# Patient Record
Sex: Female | Born: 1970 | Race: White | Hispanic: No | Marital: Married | Smoking: Current every day smoker
Health system: Southern US, Community
[De-identification: ages and names within clinical notes are randomized; demographics above are authoritative.]

## PROBLEM LIST (undated history)

## (undated) DIAGNOSIS — F329 Major depressive disorder, single episode, unspecified: Secondary | ICD-10-CM

## (undated) DIAGNOSIS — I10 Essential (primary) hypertension: Secondary | ICD-10-CM

## (undated) DIAGNOSIS — F32A Depression, unspecified: Secondary | ICD-10-CM

## (undated) DIAGNOSIS — F419 Anxiety disorder, unspecified: Secondary | ICD-10-CM

## (undated) DIAGNOSIS — G4700 Insomnia, unspecified: Secondary | ICD-10-CM

## (undated) DIAGNOSIS — M47817 Spondylosis without myelopathy or radiculopathy, lumbosacral region: Secondary | ICD-10-CM

## (undated) HISTORY — PX: CHOLECYSTECTOMY: SHX55

## (undated) HISTORY — PX: TOTAL ABDOMINAL HYSTERECTOMY: SHX209

## (undated) HISTORY — PX: OOPHORECTOMY: SHX86

## (undated) HISTORY — PX: LUMBAR MICRODISCECTOMY: SHX99

---

## 1998-05-11 ENCOUNTER — Ambulatory Visit (HOSPITAL_COMMUNITY): Admission: RE | Admit: 1998-05-11 | Discharge: 1998-05-11 | Payer: Self-pay | Admitting: Orthopaedic Surgery

## 1998-05-25 ENCOUNTER — Ambulatory Visit (HOSPITAL_COMMUNITY): Admission: RE | Admit: 1998-05-25 | Discharge: 1998-05-25 | Payer: Self-pay | Admitting: Orthopaedic Surgery

## 1998-06-08 ENCOUNTER — Ambulatory Visit (HOSPITAL_COMMUNITY): Admission: RE | Admit: 1998-06-08 | Discharge: 1998-06-08 | Payer: Self-pay | Admitting: Orthopaedic Surgery

## 2000-11-05 ENCOUNTER — Encounter: Admission: RE | Admit: 2000-11-05 | Discharge: 2000-11-05 | Payer: Self-pay | Admitting: Obstetrics and Gynecology

## 2000-11-05 ENCOUNTER — Encounter: Payer: Self-pay | Admitting: Obstetrics and Gynecology

## 2004-06-29 ENCOUNTER — Encounter: Admission: RE | Admit: 2004-06-29 | Discharge: 2004-06-29 | Payer: Self-pay | Admitting: Neurosurgery

## 2004-07-13 ENCOUNTER — Encounter: Admission: RE | Admit: 2004-07-13 | Discharge: 2004-07-13 | Payer: Self-pay | Admitting: Neurosurgery

## 2004-08-16 ENCOUNTER — Ambulatory Visit (HOSPITAL_COMMUNITY): Admission: RE | Admit: 2004-08-16 | Discharge: 2004-08-16 | Payer: Self-pay | Admitting: Neurosurgery

## 2004-08-25 ENCOUNTER — Encounter: Admission: RE | Admit: 2004-08-25 | Discharge: 2004-08-25 | Payer: Self-pay | Admitting: Neurosurgery

## 2004-12-05 ENCOUNTER — Encounter: Admission: RE | Admit: 2004-12-05 | Discharge: 2004-12-05 | Payer: Self-pay | Admitting: Neurosurgery

## 2004-12-20 ENCOUNTER — Encounter: Admission: RE | Admit: 2004-12-20 | Discharge: 2004-12-20 | Payer: Self-pay | Admitting: Neurosurgery

## 2005-09-08 ENCOUNTER — Encounter: Admission: RE | Admit: 2005-09-08 | Discharge: 2005-09-08 | Payer: Self-pay | Admitting: Neurosurgery

## 2008-07-10 ENCOUNTER — Ambulatory Visit (HOSPITAL_COMMUNITY): Admission: RE | Admit: 2008-07-10 | Discharge: 2008-07-11 | Payer: Self-pay | Admitting: Neurosurgery

## 2008-11-03 ENCOUNTER — Ambulatory Visit (HOSPITAL_COMMUNITY): Admission: RE | Admit: 2008-11-03 | Discharge: 2008-11-03 | Payer: Self-pay | Admitting: Neurosurgery

## 2010-06-15 IMAGING — CT CT L SPINE W/ CM
2 of 13 series · 3 of 20 positions shown, 4 images · IV contrast (omnipaque)
Comparison: 09/08/2005

Addendum Begins

Dr. Gisella came by the discuss the case.  What I do not appreciate
is that the patient had a previous MRI at [REDACTED]
dated 09/03/2008.  She has had a previous left foraminal to extra
foraminal discectomy at L3-4.  Some of the foraminal density we are
seeing on the left relates to that.  However, there does appear to
be some recurrence of disc herniation in the left foraminal to
extraforaminal region and it is possible the left L3 nerve root
could be affected.
Addendum Ends
CLINICAL DATA: Back pain.  Bilateral hip pain, left worse than
right.
MYELOGRAM LUMBAR
TECHNIQUE: Lumbar puncture and contrast injection were performed by
Dr. Gisella. Following injection of intrathecal Omnipaque contrast,
spine imaging in multiple projections was performed using
fluoroscopy.
Fluoroscopy Time: 1.0 minutes.
TECHNIQUE: CT imaging of the lumbar spine was performed after
intrathecal contrast administration.  Multiplanar CT image
reconstructions were also generated.

[Series 104: coronal l-spine detail · coronal · 0.43mm/px · 1 of 52 slices shown]
[im 26/52  bone]
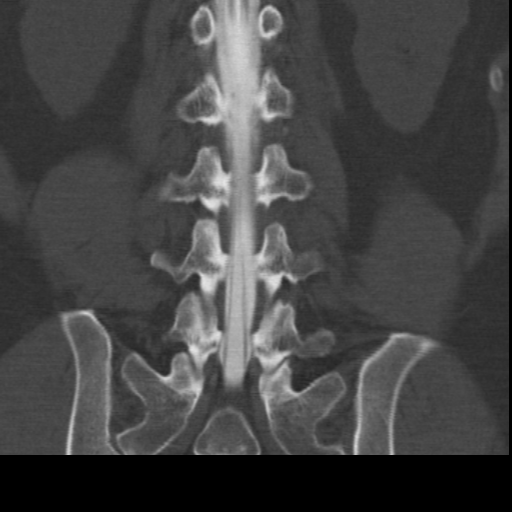

[Series 105: cororthogonal l-spine detail · axial · 0.43mm/px · z∈[-287,-61]mm · 2 of 66 slices shown, 3 images]
[im 1/66  soft-tissue]
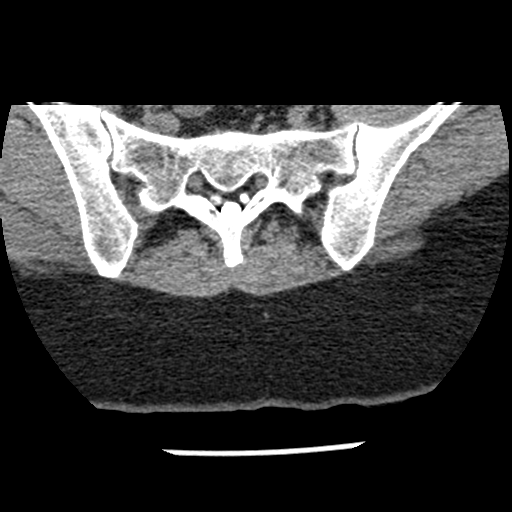
[im 1/66  bone]
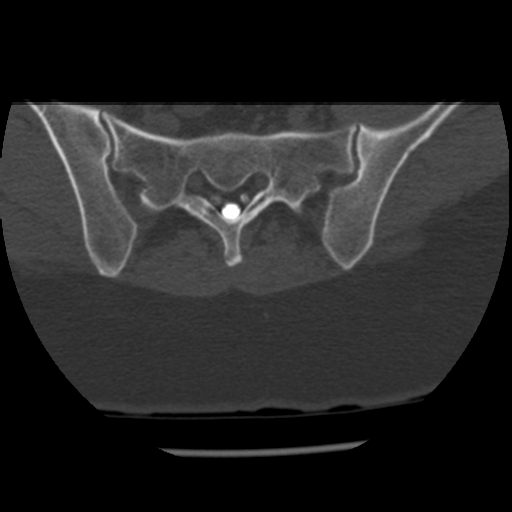
[im 66/66  bone]
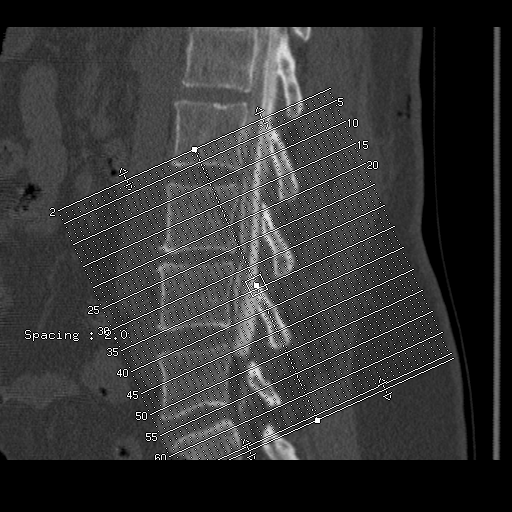

[3 of 20 positions shown; findings below may reference images not displayed]

FINDINGS: There is a small anterior extradural defect at L3-4.
Otherwise, examination appears normal.  No abnormality of root
sleeve filling.
IMPRESSION: Small anterior extradural defect at L3-4.

CT MYELOGRAPHY LUMBAR SPINE
FINDINGS: L1-2:  Normal

L2-3:  Normal

L3-4:  Shallow protrusion of disc material indents the ventral
aspect of the thecal sac does not appear to cause compressive
stenosis of the central canal.  There is some focal prominence of
disc material on the left foraminal to extraforaminal region that
could effect the L3 nerve root.

L4-5:  Mild bulging of the disc.  No central canal stenosis.  Focal
prominence in the right foraminal to extraforaminal region that
could effect the right L4 nerve root.

L5-S1:  Minimal disc bulge.  Canal and foramina widely patent.

Compared to 3773, the findings appear unchanged.
IMPRESSION: L3-4:  Disc bulge, focally prominent in the left foraminal to
extraforaminal region.  This could effect the left L3 nerve root.

L4-5:  Disc bulge focally prominent in the right foraminal to
extraforaminal region.  This could effect the right L4 nerve root.

No significant change since the examination of 09/08/2005.  It is
possible the left sided pathology at L3-4 slightly more prominent,
but that is debatable.

## 2010-12-20 NOTE — Discharge Summary (Signed)
NAMEANJANETTE, GILKEY             ACCOUNT NO.:  [id number]   MEDICAL RECORD NO.:  [id number]          PATIENT TYPE:  OIB   LOCATION:  3538                         FACILITY:  [facility]   PHYSICIAN:  Coletta Memos, M.D.     DATE OF BIRTH:  [birth year]   DATE OF ADMISSION:  07/10/2008  DATE OF DISCHARGE:  07/11/2008                               DISCHARGE SUMMARY   ADMITTING DIAGNOSIS:  Far lateral disk herniation left L3-4.   DISCHARGE DIAGNOSIS:  Left L3-4 far lateral disk herniation.   PROCEDURE:  Far lateral diskectomy left L3-4.   COMPLICATIONS:  None.   DISCHARGE STATUS:  Alive and well.   She is able to void, walk, and eat.  Wound is clean, dry.  No signs of  infection at discharge.   Medications which are new include Neurontin and Percocet.  She is to  continue on her other home medications with the exception of the  hydrocodone and tramadol.   Ms. Swenson was admitted with a far lateral disk herniation at L3-4.  She  had pain on both right and left side.  I explained to her preoperatively  the right-sided pain would in no way be accounted for by left-sided  disk.  She is still complaining of pain after operation on the left side  at L3-4.  Operation went quite well.  I did remove disk material, felt  that the nerve was compressed initially, and was decompressed at the end  of the operation.  Everything went quite well.  I did leave her with  steroids and fentanyl over the nerve root.  I also placed on Neurontin  as a precautionary move, and she will take that for a month.  She was  given instructions no lifting, bending, or twisting.  She will be  discharged home today.           ______________________________  Coletta Memos, M.D.     KC/MEDQ  D:  07/11/2008  T:  07/11/2008  Job:  [id number]

## 2010-12-20 NOTE — Op Note (Signed)
NAMESHAWNESE, [name]             ACCOUNT NO.:  [id number]   MEDICAL RECORD NO.:  [id number]          PATIENT TYPE:  OIB   LOCATION:  3538                         FACILITY:  [facility]   PHYSICIAN:  Coletta Memos, M.D.     DATE OF BIRTH:  28-Dec-1970   DATE OF PROCEDURE:  07/10/2008  DATE OF DISCHARGE:                               OPERATIVE REPORT   PREOPERATIVE DIAGNOSIS:  Far lateral disk herniation left L3-4.   POSTOPERATIVE DIAGNOSIS:  Far lateral disk herniation left L3-4.   PROCEDURE:  Left far lateral diskectomy L3-4 with microscopic  dissection.   COMPLICATIONS:  None.   SURGEON:  Coletta Memos, MD   ASSISTANT:  Danae Orleans. Venetia Maxon, MD   INDICATIONS:  Michaela Stevenson is a woman, who I have followed now for a  number of years, who presented again with pain in the left lower  extremity.  She had a discrete disk herniation at L3-4 on the left side  and the neural foramen.  I therefore offered and she agreed to undergo  operative decompression.   OPERATIVE NOTE:  Ms. Skelley was brought to the operating room,  intubated, and placed under general anesthetic without difficulty.  Her  back was prepped.  She was draped in a sterile fashion.  I opened the  skin with a #10 blade and took this down to the thoracolumbar fascia.  I  exposed the lamina of L2 and L3.  I was able to confirm my location at  the L3-4 disk space intraoperatively.  I then proceeded to drill out the  lateral portion of the pars.  I used a Kerrison punch and then removed  that.  I was able to then identify the L3 root.  I retracted that and  opened the disk space.  This was done with microscopic dissection.  With  further microscopic dissection, Dr. Venetia Maxon and I was then able to remove  more disk and fully decompress the left L3 root in a far lateral  position.  I was very satisfied with the decompression.  I then achieved  hemostasis.  I then placed fentanyl and Depo-Medrol over the nerve root.  I then closed the  wound in layered fashion using Vicryl sutures to  reapproximate the thoracolumbar, subcutaneous, and subcuticular layers.           ______________________________  Coletta Memos, M.D.     KC/MEDQ  D:  07/10/2008  T:  07/11/2008  Job:  [id number]

## 2011-05-12 LAB — BASIC METABOLIC PANEL
CO2: 27 mEq/L (ref 19–32)
Chloride: 100 mEq/L (ref 96–112)
GFR calc Af Amer: >60 mL/min (ref >60)
Glucose, Bld: 104 mg/dL — ABNORMAL HIGH (ref 70–99)
Potassium: 4 mEq/L (ref 3.5–5.1)
Sodium: 133 mEq/L — ABNORMAL LOW (ref 135–145)

## 2011-05-12 LAB — CBC
HCT: 42.1 % (ref 36.0–46.0)
Hemoglobin: 14.6 g/dL (ref 12.0–15.0)
MCHC: 34.8 g/dL (ref 30.0–36.0)
RBC: 4.5 MIL/uL (ref 3.87–5.11)
RDW: 12.9 % (ref 11.5–15.5)

## 2014-02-11 ENCOUNTER — Inpatient Hospital Stay (HOSPITAL_COMMUNITY): Payer: Self-pay

## 2014-02-11 ENCOUNTER — Inpatient Hospital Stay (HOSPITAL_COMMUNITY)
Admit: 2014-02-11 | Discharge: 2014-02-13 | DRG: 682 | Disposition: A | Discharge status: Home / Self Care | Payer: Self-pay | Source: Other Acute Inpatient Hospital | Attending: Internal Medicine | Admitting: Internal Medicine

## 2014-02-11 ENCOUNTER — Encounter (HOSPITAL_COMMUNITY): Payer: Self-pay | Admitting: Internal Medicine

## 2014-02-11 DIAGNOSIS — G894 Chronic pain syndrome: Secondary | ICD-10-CM | POA: Diagnosis present

## 2014-02-11 DIAGNOSIS — R34 Anuria and oliguria: Secondary | ICD-10-CM | POA: Diagnosis present

## 2014-02-11 DIAGNOSIS — F329 Major depressive disorder, single episode, unspecified: Secondary | ICD-10-CM | POA: Diagnosis present

## 2014-02-11 DIAGNOSIS — E871 Hypo-osmolality and hyponatremia: Secondary | ICD-10-CM | POA: Diagnosis present

## 2014-02-11 DIAGNOSIS — E869 Volume depletion, unspecified: Secondary | ICD-10-CM | POA: Diagnosis present

## 2014-02-11 DIAGNOSIS — I10 Essential (primary) hypertension: Secondary | ICD-10-CM | POA: Diagnosis present

## 2014-02-11 DIAGNOSIS — E876 Hypokalemia: Secondary | ICD-10-CM | POA: Diagnosis present

## 2014-02-11 DIAGNOSIS — F17200 Nicotine dependence, unspecified, uncomplicated: Secondary | ICD-10-CM | POA: Diagnosis present

## 2014-02-11 DIAGNOSIS — IMO0001 Reserved for inherently not codable concepts without codable children: Secondary | ICD-10-CM | POA: Diagnosis present

## 2014-02-11 DIAGNOSIS — E669 Obesity, unspecified: Secondary | ICD-10-CM | POA: Diagnosis present

## 2014-02-11 DIAGNOSIS — E878 Other disorders of electrolyte and fluid balance, not elsewhere classified: Secondary | ICD-10-CM | POA: Diagnosis present

## 2014-02-11 DIAGNOSIS — Z6832 Body mass index (BMI) 32.0-32.9, adult: Secondary | ICD-10-CM

## 2014-02-11 DIAGNOSIS — F411 Generalized anxiety disorder: Secondary | ICD-10-CM | POA: Diagnosis present

## 2014-02-11 DIAGNOSIS — I959 Hypotension, unspecified: Secondary | ICD-10-CM

## 2014-02-11 DIAGNOSIS — N179 Acute kidney failure, unspecified: Principal | ICD-10-CM | POA: Diagnosis present

## 2014-02-11 DIAGNOSIS — G4700 Insomnia, unspecified: Secondary | ICD-10-CM | POA: Diagnosis present

## 2014-02-11 DIAGNOSIS — D649 Anemia, unspecified: Secondary | ICD-10-CM | POA: Diagnosis present

## 2014-02-11 DIAGNOSIS — Z8249 Family history of ischemic heart disease and other diseases of the circulatory system: Secondary | ICD-10-CM

## 2014-02-11 DIAGNOSIS — M1990 Unspecified osteoarthritis, unspecified site: Secondary | ICD-10-CM | POA: Diagnosis present

## 2014-02-11 DIAGNOSIS — G9340 Encephalopathy, unspecified: Secondary | ICD-10-CM

## 2014-02-11 DIAGNOSIS — E785 Hyperlipidemia, unspecified: Secondary | ICD-10-CM | POA: Diagnosis present

## 2014-02-11 DIAGNOSIS — F3289 Other specified depressive episodes: Secondary | ICD-10-CM | POA: Diagnosis present

## 2014-02-11 HISTORY — DX: Major depressive disorder, single episode, unspecified: F32.9

## 2014-02-11 HISTORY — DX: Anxiety disorder, unspecified: F41.9

## 2014-02-11 HISTORY — DX: Spondylosis without myelopathy or radiculopathy, lumbosacral region: M47.817

## 2014-02-11 HISTORY — DX: Depression, unspecified: F32.A

## 2014-02-11 HISTORY — DX: Insomnia, unspecified: G47.00

## 2014-02-11 HISTORY — DX: Essential (primary) hypertension: I10

## 2014-02-11 LAB — HEPATIC FUNCTION PANEL
ALK PHOS: 93 U/L (ref 39–117)
ALT: 34 U/L (ref 0–35)
AST: 60 U/L — ABNORMAL HIGH (ref 0–37)
Albumin: 3.5 g/dL (ref 3.5–5.2)
Total Bilirubin: 0.3 mg/dL (ref 0.3–1.2)
Total Protein: 7 g/dL (ref 6.0–8.3)

## 2014-02-11 LAB — CBC
HEMATOCRIT: 32.8 % — AB (ref 36.0–46.0)
Hemoglobin: 11.3 g/dL — ABNORMAL LOW (ref 12.0–15.0)
MCH: 30.8 pg (ref 26.0–34.0)
MCHC: 34.5 g/dL (ref 30.0–36.0)
MCV: 89.4 fL (ref 78.0–100.0)
Platelets: 185 10*3/uL (ref 150–400)
RBC: 3.67 MIL/uL — AB (ref 3.87–5.11)
RDW: 13.1 % (ref 11.5–15.5)
WBC: 9 10*3/uL (ref 4.0–10.5)

## 2014-02-11 LAB — BASIC METABOLIC PANEL
ANION GAP: 17 — AB (ref 5–15)
BUN: 53 mg/dL — AB (ref 6–23)
CO2: 27 mEq/L (ref 19–32)
CREATININE: 5.86 mg/dL — AB (ref 0.50–1.10)
Calcium: 8.4 mg/dL (ref 8.4–10.5)
Chloride: 86 mEq/L — ABNORMAL LOW (ref 96–112)
GFR calc non Af Amer: 8 mL/min — ABNORMAL LOW (ref >90)
GFR, EST AFRICAN AMERICAN: 9 mL/min — AB (ref >90)
Glucose, Bld: 110 mg/dL — ABNORMAL HIGH (ref 70–99)
POTASSIUM: 3.5 meq/L — AB (ref 3.7–5.3)
Sodium: 130 mEq/L — ABNORMAL LOW (ref 137–147)

## 2014-02-11 LAB — URINALYSIS W MICROSCOPIC (NOT AT ARMC)
Bilirubin Urine: NEGATIVE
GLUCOSE, UA: NEGATIVE mg/dL
Ketones, ur: NEGATIVE mg/dL
Nitrite: NEGATIVE
PH: 5 (ref 5.0–8.0)
Protein, ur: NEGATIVE mg/dL
Specific Gravity, Urine: 1.01 (ref 1.005–1.030)
Urobilinogen, UA: 0.2 mg/dL (ref 0.0–1.0)

## 2014-02-11 LAB — PHOSPHORUS: PHOSPHORUS: 5 mg/dL — AB (ref 2.3–4.6)

## 2014-02-11 LAB — HIV ANTIBODY (ROUTINE TESTING W REFLEX): HIV: NONREACTIVE

## 2014-02-11 LAB — VITAMIN B12: Vitamin B-12: 802 pg/mL (ref 211–911)

## 2014-02-11 LAB — MAGNESIUM: Magnesium: 2.2 mg/dL (ref 1.5–2.5)

## 2014-02-11 LAB — TSH: TSH: 0.813 u[IU]/mL (ref 0.350–4.500)

## 2014-02-11 LAB — RPR

## 2014-02-11 MED ORDER — SODIUM CHLORIDE 0.9 % IV BOLUS (SEPSIS)
500.0000 mL | Freq: Once | INTRAVENOUS | Status: AC
  Administered 2014-02-11: 500 mL via INTRAVENOUS

## 2014-02-11 MED ORDER — ONDANSETRON HCL 4 MG PO TABS
4.0000 mg | ORAL_TABLET | Freq: Four times a day (QID) | ORAL | Status: DC | PRN
Start: 2014-02-11 — End: 2014-02-13

## 2014-02-11 MED ORDER — CALCIUM CARBONATE ANTACID 500 MG PO CHEW
400.0000 mg | CHEWABLE_TABLET | Freq: Three times a day (TID) | ORAL | Status: DC | PRN
  Filled 2014-02-11: qty 2

## 2014-02-11 MED ORDER — ACETAMINOPHEN 325 MG PO TABS: 650.0000 mg | ORAL_TABLET | Freq: Four times a day (QID) | ORAL | Status: DC | PRN

## 2014-02-11 MED ORDER — PANTOPRAZOLE SODIUM 20 MG PO TBEC
20.0000 mg | DELAYED_RELEASE_TABLET | Freq: Every day | ORAL | Status: DC
  Administered 2014-02-11 – 2014-02-13 (×3): 20 mg via ORAL
  Filled 2014-02-11 (×3): qty 1

## 2014-02-11 MED ORDER — HYDROMORPHONE HCL PF 1 MG/ML IJ SOLN: 0.5000 mg | INTRAMUSCULAR | Status: DC | PRN

## 2014-02-11 MED ORDER — ACETAMINOPHEN 650 MG RE SUPP
650.0000 mg | Freq: Four times a day (QID) | RECTAL | Status: DC | PRN
Start: 2014-02-11 — End: 2014-02-13

## 2014-02-11 MED ORDER — OXYCODONE HCL 5 MG PO TABS
5.0000 mg | ORAL_TABLET | ORAL | Status: DC | PRN
  Administered 2014-02-11 – 2014-02-13 (×4): 5 mg via ORAL
  Filled 2014-02-11 (×4): qty 1

## 2014-02-11 MED ORDER — SODIUM CHLORIDE 0.9 % IV SOLN
INTRAVENOUS | Status: DC
Start: 2014-02-11 — End: 2014-02-13
  Administered 2014-02-11 – 2014-02-13 (×5): via INTRAVENOUS

## 2014-02-11 MED ORDER — ONDANSETRON HCL 4 MG/2ML IJ SOLN
4.0000 mg | Freq: Four times a day (QID) | INTRAMUSCULAR | Status: DC | PRN
  Administered 2014-02-12: 4 mg via INTRAVENOUS
  Filled 2014-02-11 (×2): qty 2

## 2014-02-11 MED ORDER — SODIUM CHLORIDE 0.9 % IJ SOLN
3.0000 mL | Freq: Two times a day (BID) | INTRAMUSCULAR | Status: DC
  Administered 2014-02-11 (×2): 3 mL via INTRAVENOUS

## 2014-02-11 MED ORDER — ALUM & MAG HYDROXIDE-SIMETH 200-200-20 MG/5ML PO SUSP
30.0000 mL | Freq: Four times a day (QID) | ORAL | Status: DC | PRN
Start: 2014-02-11 — End: 2014-02-11

## 2014-02-11 NOTE — Progress Notes (Signed)
Patient ID: [name] female   DOB: May 08, 1971 43 y.o.   MRN: [id number]  Reason for Consult:AKI Referring Physician: Dr. Carles Collet  HPI: Michaela Stevenson is a 43 y.o. female with a PMH significant for HTN, depression/anxiety/ADHD, chronic pain (on multiple pain meds-UDS +for amphetamines, opiates, benzos), DJD who presents from [facility] ED with SOB and not being able to pass urine for the past couple of days.  She denies any fever/chills, CP, abdominal pain, dysuria, or hematuria.  Her son tells me that she has been out of her oxycodone for almost a week and she has been having nausea, vomiting, and some diarrhea with poor po intake.  Denies any hematochezia or melena.  In the [facility] ED, she had a Cr 7.4/BUN 58.  Sodium of 128, and potassium of 3.3 and was also hypotensive with BP of of 83/48.  She was given 2 L of NS with improvement of BP and was transfered to [facility] possible dialysis and we were consulted. She denies any recreational drug use, smokes 1 ppd for the past several years, denies any alcohol use.  She has been taking ibuprofen and tylenol for the past week.  Regularly uses simvastatin and  lisinopril.  No FH of kidney disease.  No prior history of any kidney issues and no family members on dialysis.  No rashes. No new medications, no recent procedures.  She is disabled due to chronic back pain.    PMH:   Past Medical History  Diagnosis Date  . Benign essential hypertension   . DJD (degenerative joint disease), lumbosacral   . Depression   . Anxiety   . Insomnia     PSH:   Past Surgical History  Procedure Laterality Date  . Total abdominal hysterectomy    . Oophorectomy    . Cholecystectomy    . Lumbar microdiscectomy      Allergies:  Allergies  Allergen Reactions  . Ketek [Telithromycin] Shortness Of Breath and Diarrhea  . Onion Nausea And Vomiting  . Penicillins Hives and Itching  . Prednisone Hives, Itching and Swelling    Medications:   Prior to Admission  medications   Not on File    Discontinued Meds:   Medications Discontinued During This Encounter  Medication Reason  . alum & mag hydroxide-simeth (MAALOX/MYLANTA) 200-200-20 MG/5ML suspension 30 mL     Social History:  reports that she has been smoking Cigarettes.  She has been smoking about 1.00 pack per day. She does not have any smokeless tobacco history on file. She reports that she does not drink alcohol or use illicit drugs.  Family History:   Family History  Problem Relation Age of Onset  . CAD Father   . CAD Mother     Review of Systems: Noted in HPI.   Creatinine, Ser  Date/Time Value Ref Range Status  02/11/2014  4:20 AM 5.86* 0.50 - 1.10 mg/dL Final  07/08/2008  1:59 PM 0.94  0.4 - 1.2 mg/dL Final    Recent Labs Lab 02/11/14 0420  NA 130*  K 3.5*  CL 86*  CO2 27  GLUCOSE 110*  BUN 53*  CREATININE 5.86*  CALCIUM 8.4    Recent Labs Lab 02/11/14 0420  WBC 9.0  HGB 11.3*  HCT 32.8*  MCV 89.4  PLT 185   Liver Function Tests: No results found for this basename: AST, ALT, ALKPHOS, BILITOT, PROT, ALBUMIN,  in the last 168 hours No results found for this basename: LIPASE, AMYLASE,  in the  last 168 hours No results found for this basename: AMMONIA,  in the last 168 hours Cardiac Enzymes: No results found for this basename: CKTOTAL, CKMB, CKMBINDEX, TROPONINI,  in the last 168 hours Iron Studies: No results found for this basename: IRON, TIBC, TRANSFERRIN, FERRITIN,  in the last 72 hours  Results for orders placed during the hospital encounter of 02/11/14 (from the past 48 hour(s))  BASIC METABOLIC PANEL     Status: Abnormal   Collection Time    02/11/14  4:20 AM      Result Value Ref Range   Sodium 130 (*) 137 - 147 mEq/L   Potassium 3.5 (*) 3.7 - 5.3 mEq/L   Chloride 86 (*) 96 - 112 mEq/L   CO2 27  19 - 32 mEq/L   Glucose, Bld 110 (*) 70 - 99 mg/dL   BUN 53 (*) 6 - 23 mg/dL   Creatinine, Ser 5.86 (*) 0.50 - 1.10 mg/dL   Calcium 8.4  8.4 - 10.5  mg/dL   GFR calc non Af Amer 8 (*) >90 mL/min   GFR calc Af Amer 9 (*) >90 mL/min   Comment: (NOTE)     The eGFR has been calculated using the CKD EPI equation.     This calculation has not been validated in all clinical situations.     eGFR's persistently <90 mL/min signify possible Chronic Kidney     Disease.   Anion gap 17 (*) 5 - 15  CBC     Status: Abnormal   Collection Time    02/11/14  4:20 AM      Result Value Ref Range   WBC 9.0  4.0 - 10.5 K/uL   RBC 3.67 (*) 3.87 - 5.11 MIL/uL   Hemoglobin 11.3 (*) 12.0 - 15.0 g/dL   HCT 32.8 (*) 36.0 - 46.0 %   MCV 89.4  78.0 - 100.0 fL   MCH 30.8  26.0 - 34.0 pg   MCHC 34.5  30.0 - 36.0 g/dL   RDW 13.1  11.5 - 15.5 %   Platelets 185  150 - 400 K/uL  TSH     Status: None   Collection Time    02/11/14 12:34 PM      Result Value Ref Range   TSH 0.813  0.350 - 4.500 uIU/mL    No results found.  Physical Exam: Blood pressure 100/84, pulse 80, temperature 98.5 F (36.9 C), temperature source Oral, resp. rate 16, height 5' 2.5" (1.588 m), weight 181 lb (82.1 kg), SpO2 95.00%. Constitutional: Vital signs reviewed.  Patient is disheveled appearing and is slow to respond.  HEENT: [place]/AT, PERRL, EOMI, conjunctivae normal, no scleral icterus, moist mucous membranes Neck: Supple, trachea midline normal ROM, no JVD Cardiovascular: RRR, no MRG Pulmonary/Chest: normal effort, CTAB, no wheezes, rales, or rhonchi Abdominal: Soft. Non-tender, non-distended, bowel sounds are normal Extremities: no C/C/E, nails are dirty  Neurological: A&O x3, cranial nerve II-XII are grossly intact, moving all extremities Skin: Warm, dry and intact. No rash.    Assessment/Plan:  AKI- Baseline creatinine ~1.0.  Most likely etiology multifactorial with prerenal more likely given the response to IVF. Cr/BUN ratio<20.  Likely related to volume depletion from decreased po intake, ACEi, vomiting/diarrhea, NSAID use, and on multiple anticholinergics.  Today Cr  trending down (Cr 5.86/BUN 53). Will continue to monitor, no urgent need for HD.   -continue IVF '@100ml' /h -repeat UA   -renal US  -d/c ACEi, anticholinergics  -strict I/O, daily weights -renal function panel  to monitor electolytes -check phos, mg -avoid nephrotoxins   Metabolic abnormalities-  Hyponatremia, hypokalemia, and hypochloremia.  Calcium and albumin wnl.  -continue IVF '@100ml' /h  H/O HTN-  stable, BP still soft -would all all antihypertensives   -continue IVF  Dyslipidemia Currently on simvastatin. -would hold in the setting of increased CK (~1000)  -check LFTs  Mild anemia-  Stable, no active bleeding, likely dilutional given hgb 12.7 at [place].  -check anemia panel   Chronic pain- -avoid NSAIDS  Jones Bales, MD Internal Medicine Teaching Service, PGY-2 02/11/2014, 3:25 PM

## 2014-02-11 NOTE — Progress Notes (Signed)
Utilization review completed.  

## 2014-02-11 NOTE — Progress Notes (Addendum)
New Admission Note:   Arrival: via [facility] on stretcher Mental Orientation: A&Ox4 Telemetry: no orders at this time Assessment:  See doc flowsheet Skin: intact, old small incision from "back surgery," small scratches on legs bilaterally from pet cats per pt IV: left AC, clean, dry, intact, infusing Pain: 8/10 generalized pain Safety Measures:  Call bell placed within reach; patient instructed on use of call bell and verbalized understanding. Bed in lowest position.  Non-skid socks on.   [address] [place] Orientation: Patient oriented to staff, room, and unit. Family: none at bedside  No orders at this time.  Paged [facility] Ad[facility].  Will continue to monitor. Admission questions deferred to AM per patient request.  Rozann Leschesebecca Destony Prevost, RN, BSN

## 2014-02-11 NOTE — Progress Notes (Signed)
PROGRESS NOTE  Michaela Stevenson ZOX:[id number] DOB: Jul 11, 1971 DOA: 02/11/2014 PCP: No PCP Per Patient  Interim history 43 year old female with a history of hypertension, depression, fibromyalgia admitted for low urine output. The patient is a very poor historian. [facility], BUN was 58, serum creatinine 7.4. The patient was noted to be hypotensive and given 2 L normal saline. During the interview on 02/11/2014, the patient appears to be confused with tangential history. The patient's only complaint is that of insomnia with the inability to sleep for the last 72 hours. Assessment/Plan: AKI -pt had serum creatinine 7.40 at [facility] -unclear renal baseline -renal US -may be due to rhabdomyolysis--CPK 1188--in the setting of lisinopril use -recheck CK in am -continue IVF -Check urine myoglobin -consult nephrology--spoke with Dr. Kathe MarinerJ. Patel Acute Encephalopathy -likely multifactorial related to meds and AKI -CT brain -ammonia -B12, TSH, RPR, HIV -I tried to contact all listed phone numbers for family without success--I have left voice mails for possible call back Hypertension -Patient's blood pressure is soft -Discontinue lisinopril -IV fluids  Chronic pain  -U[facility] screen from [facility] shows amphetamines and opioids -Patient denies any use of amphetamines  -Hold opioids for now    Family Communication:   Tried 5 phone numbers without success Disposition Plan:   Home when medically stable    Procedures/Studies:  No results found.      Subjective: Patient is a poor historian. She is unable to provide any significant history.  Patient denies fevers, chills, headache, chest pain, dyspnea, nausea, vomiting, diarrhea, abdominal pain, dysuria, hematuria   Objective: Filed Vitals:   02/11/14 0137 02/11/14 0401 02/11/14 0445  BP: 113/82 84/45 80/60   Pulse: 87 72   Temp: 98.1 F (36.7 C) 97.8 F (36.6 C)   TempSrc: Oral Oral   Resp: 16 14    Height: 5' 2.5" (1.588 m)    Weight: 82.1 kg (181 lb)    SpO2: 98% 94%     Intake/Output Summary (Last 24 hours) at 02/11/14 0931 Last data filed at 02/11/14 0300  Gross per 24 hour  Intake    100 ml  Output    960 ml  Net   -860 ml   Weight change:  Exam:   General:  Pt is alert, follows commands appropriately, not in acute distress  HEENT: No icterus, No thrush,[place]/AT  Cardiovascular: RRR, S1/S2, no rubs, no gallops  Respiratory: CTA bilaterally, no wheezing, no crackles, no rhonchi  Abdomen: Soft/+BS, non tender, non distended, no guarding  Extremities: trace LE edema, No lymphangitis, No petechiae, No rashes, no synovitis  Data Reviewed: Basic Metabolic Panel:  Recent Labs Lab 02/11/14 0420  NA 130*  K 3.5*  CL 86*  CO2 27  GLUCOSE 110*  BUN 53*  CREATININE 5.86*  CALCIUM 8.4   Liver Function Tests: No results found for this basename: AST, ALT, ALKPHOS, BILITOT, PROT, ALBUMIN,  in the last 168 hours No results found for this basename: LIPASE, AMYLASE,  in the last 168 hours No results found for this basename: AMMONIA,  in the last 168 hours CBC:  Recent Labs Lab 02/11/14 0420  WBC 9.0  HGB 11.3*  HCT 32.8*  MCV 89.4  PLT 185   Cardiac Enzymes: No results found for this basename: CKTOTAL, CKMB, CKMBINDEX, TROPONINI,  in the last 168 hours BNP: No components found with this basename: POCBNP,  CBG: No results found for this basename: GLUCAP,  in the  last 168 hours  No results found for this or any previous visit (from the past 240 hour(s)).   Scheduled Meds: . pantoprazole  20 mg Oral Daily  . sodium chloride  3 mL Intravenous Q12H   Continuous Infusions: . sodium chloride 100 mL/hr at 02/11/14 0230     Renada Cronin, DO  [facility] Pager [phone]  If 7PM-7AM, please contact night-coverage [email or web address] Password [email or web address] 02/11/2014, 9:31 AM   LOS: 0 days

## 2014-02-11 NOTE — Progress Notes (Signed)
BP was 84/45.  NP on call notified; orders received for 500 cc bolus of NS.  Rechecked BP after completion of bolus.  BP manually was 80/60.  NP on call paged; no new orders received yet.  Will continue to monitor.

## 2014-02-11 NOTE — Progress Notes (Signed)
I have personally seen and examined this patient and agree with the assessment/plan as outlined above by Delane GingerGill MD (PGY2). 43 year old Caucasian woman with acute renal failure that appears to be of prerenal mechanism (poor oral intake in the face of ongoing nausea, vomiting and diarrhea with ACE inhibitor/nonsteroidal anti-inflammatory drug use)-overnight has shown improvement with intravenous fluids and anticipate that this will continue. I agree with holding her ACE inhibitor and statin at this point. No overwhelming evidence that this is rhabdomyolysis. Renal ultrasound pending.  Zayven Powe K.,MD 02/11/2014 3:53 PM

## 2014-02-11 NOTE — H&P (Addendum)
[facility] History and Physical  Michaela Stevenson OTL:[id number] DOB: 1970-08-12 DOA: 02/11/2014  Referring physician:  PCP: No PCP Per Patient  Specialists:   Chief Complaint:    Unable to pass Urine  HPI: Michaela Stevenson is a 43 y.o. female with a history of HTN, Chronic Pain and Anxiety and Depression who presented to the [facility] ED with complaints of not being able to pass any urine for the past 2 days.   She denied having any ABD pain or fevers or chills.   She denied having any nausea or vomiting or diarrhea.   In the ED she was found to have a BUN of 58 and a cr of 7.4, and was also hypotensive.  Her hypotension resolved after adminstration of 2 liters of IV Fluids.   She was transfered to [facility] for Nephrology consultation in the event that she may need dialysis.     Review of Systems: Unable to Obtain from the patient due to Somnolence   Past Medical History  Diagnosis Date  . Benign essential hypertension   . DJD (degenerative joint disease), lumbosacral   . Depression   . Anxiety   . Insomnia     Past Surgical History  Procedure Laterality Date  . Total abdominal hysterectomy    . Oophorectomy    . Cholecystectomy    . Lumbar microdiscectomy       Prior to Admission medications   Not on File    Allergies  Allergen Reactions  . Penicillins Hives and Itching  . Prednisone Hives, Itching and Swelling  . Xanax [Alprazolam] Hives and Itching   Social History:  reports that she has been smoking Cigarettes.  She has been smoking about 1.00 pack per day. She does not have any smokeless tobacco history on file. She reports that she does not drink alcohol or use illicit drugs.     Family History  Problem Relation Age of Onset  . CAD Father   . CAD Mother        Physical Exam:  GEN:  Pleasant Somnolent Obese 43 y.o. Caucasian female examined  and in no acute distress; cooperative with exam Filed Vitals:   02/11/14 0137  BP: 113/82   Pulse: 87  Temp: 98.1 F (36.7 C)  TempSrc: Oral  Resp: 16  Height: 5' 2.5" (1.588 m)  Weight: 82.1 kg (181 lb)  SpO2: 98%   Blood pressure 113/82, pulse 87, temperature 98.1 F (36.7 C), temperature source Oral, resp. rate 16, height 5' 2.5" (1.588 m), weight 82.1 kg (181 lb), SpO2 98.00%. PSYCH: She is alert and oriented x4; does not appear anxious does not appear depressed; affect is normal HEENT: Normocephalic and Atraumatic, Mucous membranes pink; PERRLA; EOM intact; Fundi:  Benign;  No scleral icterus, Nares: Patent, Oropharynx: Clear, Fair Dentition, Neck:  FROM, no cervical lymphadenopathy nor thyromegaly or carotid bruit; no JVD; Breasts:: Not examined CHEST WALL: No tenderness CHEST: Normal respiration, clear to auscultation bilaterally HEART: Regular rate and rhythm; no murmurs rubs or gallops BACK: No kyphosis or scoliosis; no CVA tenderness ABDOMEN: Positive Bowel Sounds,  Obese, soft non-tender; no masses, no organomegaly Rectal Exam: Not done EXTREMITIES: No Cyanosis, Clubbing or Edema.   Genitalia: not examined PULSES: 2+ and symmetric SKIN: Normal hydration no rash or ulceration CNS:  Alert and Oriented x 3,  No Fical Deficits.    Vascular: pulses palpable throughout    Labs on Admission:  Basic Metabolic Panel: No results found for this  basename: NA, K, CL, CO2, GLUCOSE, BUN, CREATININE, CALCIUM, MG, PHOS,  in the last 168 hours Liver Function Tests: No results found for this basename: AST, ALT, ALKPHOS, BILITOT, PROT, ALBUMIN,  in the last 168 hours No results found for this basename: LIPASE, AMYLASE,  in the last 168 hours No results found for this basename: AMMONIA,  in the last 168 hours CBC: No results found for this basename: WBC, NEUTROABS, HGB, HCT, MCV, PLT,  in the last 168 hours Cardiac Enzymes: No results found for this basename: CKTOTAL, CKMB, CKMBINDEX, TROPONINI,  in the last 168 hours  BNP (last 3 results) No results found for this  basename: PROBNP,  in the last 8760 hours CBG: No results found for this basename: GLUCAP,  in the last 168 hours  Radiological Exams on Admission: No results found.    EKG: Independently reviewed.     Assessment/Plan:   43 y.o. female with  Principal Problem:   AKI (acute kidney injury) Active Problems:   Anuria   Essential hypertension, benign   Chronic pain syndrome     1.  AKI-  BUN/Cr= 58/7.4,   Not clear what her baseline BUN/Cr are,  IVFs for rehydration, Strict I/Os ordered, Monitor B-Met daily for changes,  Renal US in AM, and Consult Nephrology in AM.     2.  Anuria- Unclear Etiology,   Renal US in AM.   Her home medications need to be verified.    3.  HTN-  Was Hypotensive in ED , improved with Fluids.   Monitor BP.     4.  Chronic Pain Syndrome- on prescribed Opiate Rx.     5.  DVT prophylaxis with SCDs.        Code Status:   FULL CODE Family Communication:    No Family present Disposition Plan:     Inpatient    Time spent:  [place] [facility] Pager [phone]  If 7PM-7AM, please contact night-coverage [email or web address] Password [email or web address] 02/11/2014, 2:36 AM

## 2014-02-12 ENCOUNTER — Inpatient Hospital Stay (HOSPITAL_COMMUNITY): Payer: Self-pay

## 2014-02-12 DIAGNOSIS — N179 Acute kidney failure, unspecified: Principal | ICD-10-CM

## 2014-02-12 DIAGNOSIS — I959 Hypotension, unspecified: Secondary | ICD-10-CM

## 2014-02-12 DIAGNOSIS — G9340 Encephalopathy, unspecified: Secondary | ICD-10-CM

## 2014-02-12 DIAGNOSIS — G894 Chronic pain syndrome: Secondary | ICD-10-CM

## 2014-02-12 LAB — BASIC METABOLIC PANEL
ANION GAP: 12 (ref 5–15)
BUN: 48 mg/dL — ABNORMAL HIGH (ref 6–23)
CO2: 27 mEq/L (ref 19–32)
Calcium: 8.8 mg/dL (ref 8.4–10.5)
Chloride: 97 mEq/L (ref 96–112)
Creatinine, Ser: 3.46 mg/dL — ABNORMAL HIGH (ref 0.50–1.10)
GFR, EST AFRICAN AMERICAN: 17 mL/min — AB (ref >90)
GFR, EST NON AFRICAN AMERICAN: 15 mL/min — AB (ref >90)
Glucose, Bld: 104 mg/dL — ABNORMAL HIGH (ref 70–99)
POTASSIUM: 4.5 meq/L (ref 3.7–5.3)
SODIUM: 136 meq/L — AB (ref 137–147)

## 2014-02-12 LAB — AMMONIA: Ammonia: 15 umol/L (ref 11–60)

## 2014-02-12 LAB — FOLATE RBC: RBC FOLATE: 1130 ng/mL — AB (ref >280)

## 2014-02-12 LAB — CK: CK TOTAL: 1736 U/L — AB (ref 7–177)

## 2014-02-12 MED ORDER — ALPRAZOLAM 0.25 MG PO TABS
0.2500 mg | ORAL_TABLET | Freq: Two times a day (BID) | ORAL | Status: DC
  Administered 2014-02-12 – 2014-02-13 (×2): 0.25 mg via ORAL
  Filled 2014-02-12 (×2): qty 1

## 2014-02-12 MED ORDER — SODIUM CHLORIDE 0.9 % IV BOLUS (SEPSIS)
500.0000 mL | Freq: Once | INTRAVENOUS | Status: AC
  Administered 2014-02-12: 500 mL via INTRAVENOUS

## 2014-02-12 NOTE — Progress Notes (Signed)
Notified K. Kirby,NP of pts manual BP of 70/40. IV access recently re-established via IV team. Pt drowsy, but alert and oriented  with no complaints. No orders received at this time. Will continue to monitor.

## 2014-02-12 NOTE — Progress Notes (Signed)
I have personally seen and examined this patient and agree with the assessment/plan as outlined above by Delane GingerGill MD (PGY2). Improving renal function with IVFs and excellent UOP. Anticipated continued renal recovery-- renal US negative and renal baseline apparently normal. UA unremarkable.   [facility] K.,MD 02/12/2014 10:52 AM

## 2014-02-12 NOTE — Progress Notes (Signed)
PROGRESS NOTE  Michaela Stevenson:[id number] DOB: 1970-11-30 DOA: 02/11/2014 PCP: No PCP Per Patient  Assessment/Plan: AKI  -likely volume depletion as pt improving with IVF -pt had serum creatinine 7.40 at [facility]  -unclear renal baseline  -renal US--neg  -CPK 1188-->1736-->d/c statin -recheck CK in am  -continue IVF  -appreciate nephrology followup Acute Encephalopathy  -likely multifactorial related to meds and AKI -Pt was caught by nursing staff with with pill bottle--suspect pt is taking home medications  -informed nursing supervisor to search room and remove any home meds -CT brain  -ammonia--15  -B12--802  -TSH--0.813 -RPR, HIV--negative -minimize opioids/narcotics -tried to call son--could not leave message Hypotension -suspect this may be self induced from taking hidden home meds -continue IVF -Echo Hypertension  -Patient is now hypotensive -Discontinue lisinopril  -IV fluids  Chronic pain  -Urine drug screen from [place] shows amphetamines and opioids  -Patient denies any use of amphetamines--suspect pt is taking son's Vyvanse -Hold opioids for now  Family Communication:   Sister on telephone--total time face to face with pt Disposition Plan:   Home when medically stable       Procedures/Studies: US Renal  02/11/2014   CLINICAL DATA:  Acute kidney injury.  Anuria.  EXAM: RENAL/URINARY TRACT ULTRASOUND COMPLETE  COMPARISON:  Limited correlation is made with a CT of the lumbar spine 11/03/2008.  FINDINGS: Right Kidney:  Length: 10.6 cm. Echogenicity within normal limits. No mass or hydronephrosis visualized.  Left Kidney:  Length: 10.1 cm. Echogenicity within normal limits. No mass or hydronephrosis visualized.  Bladder:  Appears normal for degree of bladder distention.  IMPRESSION: Unremarkable renal ultrasound.  No evidence of hydronephrosis.   Electronically Signed   By: Roxy Horseman M.D.   On: 02/11/2014 16:03          Subjective: Patient denies fevers, chills, headache, chest pain, dyspnea, nausea, vomiting, diarrhea, abdominal pain, dysuria, hematuria   Objective: Filed Vitals:   02/12/14 0151 02/12/14 0255 02/12/14 0411 02/12/14 0756  BP: 74/46 71/31 97/62  93/61  Pulse:   97 75  Temp:   98.6 F (37 C) 98.8 F (37.1 C)  TempSrc:   Oral Oral  Resp:   18 18  Height:      Weight:      SpO2:   91% 99%    Intake/Output Summary (Last 24 hours) at 02/12/14 1001 Last data filed at 02/12/14 0601  Gross per 24 hour  Intake 2948.33 ml  Output   3350 ml  Net -401.67 ml   Weight change:  Exam:   General:  Pt is alert, follows commands appropriately, not in acute distress  HEENT: No icterus, No thrush,  Downs/AT  Cardiovascular: RRR, S1/S2, no rubs, no gallops  Respiratory: CTA bilaterally, no wheezing, no crackles, no rhonchi  Abdomen: Soft/+BS, non tender, non distended, no guarding  Extremities: trace LE edema, No lymphangitis, No petechiae, No rashes, no synovitis  Data Reviewed: Basic Metabolic Panel:  Recent Labs Lab 02/11/14 0420 02/11/14 1234 02/12/14 0410  NA 130*  --  136*  K 3.5*  --  4.5  CL 86*  --  97  CO2 27  --  27  GLUCOSE 110*  --  104*  BUN 53*  --  48*  CREATININE 5.86*  --  3.46*  CALCIUM 8.4  --  8.8  MG  --  2.2  --   PHOS  --  5.0*  --  Liver Function Tests:  Recent Labs Lab 02/11/14 1234  AST 60*  ALT 34  ALKPHOS 93  BILITOT 0.3  PROT 7.0  ALBUMIN 3.5   No results found for this basename: LIPASE, AMYLASE,  in the last 168 hours  Recent Labs Lab 02/12/14 0410  AMMONIA 15   CBC:  Recent Labs Lab 02/11/14 0420  WBC 9.0  HGB 11.3*  HCT 32.8*  MCV 89.4  PLT 185   Cardiac Enzymes:  Recent Labs Lab 02/12/14 0410  CKTOTAL 1736*   BNP: No components found with this basename: POCBNP,  CBG: No results found for this basename: GLUCAP,  in the last 168 hours  No results found for this or any previous visit (from  the past 240 hour(s)).   Scheduled Meds: . pantoprazole  20 mg Oral Daily  . sodium chloride  3 mL Intravenous Q12H   Continuous Infusions: . sodium chloride 100 mL/hr at 02/12/14 0601     Vona Whiters, DO  [facility] Pager [phone]  If 7PM-7AM, please contact night-coverage [email or web address] Password [email or web address] 02/12/2014, 10:01 AM   LOS: 1 day

## 2014-02-12 NOTE — Progress Notes (Signed)
Paged Dr. Carmel Sacramentoat FYI pt states she is anxious and "sweaty".  Advised Dr. Arbutus Leasat she takes xanax 1 mg qid, oxy 30 mg qid, phenergan and vistaril at home.  Pt also states she had no insurance and it is hard for her to get her medicines.

## 2014-02-12 NOTE — Progress Notes (Signed)
Michaela Stevenson, NT, stated when pt got up to go to bathroom she saw a pill bottle on person, pt denies having pill bottle, did not see pill bottle on patient.  Pt is confused, bed alarm on.

## 2014-02-12 NOTE — Progress Notes (Signed)
  Echocardiogram 2D Echocardiogram has been performed.  Michaela Stevenson, [name] 02/12/2014, 11:05 AM

## 2014-02-12 NOTE — Progress Notes (Signed)
Pt denies pill bottle.  Looked in pt bed and bedside table, also checked to see if patient had on her person, did not find any pill bottle or medication.

## 2014-02-12 NOTE — Progress Notes (Signed)
Patient ID: Michaela Stevenson female   DOB: 03-Apr-1971 43 y.o.   MRN: [id number]  S:Pt is doing well this AM.  Good UOP: 3.3L yesterday. Denies any CP, SOB, N/V/D/C.    O:  Vital signs: BP 93/61  Pulse 75  Temp(Src) 98.8 F (37.1 C) (Oral)  Resp 18  Ht 5' 2.5" (1.588 m)  Wt 181 lb (82.1 kg)  BMI 32.56 kg/m2  SpO2 99%  Intake/Output:  Intake/Output Summary (Last 24 hours) at 02/12/14 0931 Last data filed at 02/12/14 0601  Gross per 24 hour  Intake 2948.33 ml  Output   3350 ml  Net -401.67 ml    Weight change: Filed Weights   02/11/14 0137  Weight: 181 lb (82.1 kg)    Physical Exam: Gen:NAD, resting comfortably  CVS:RRR Resp:CTAB Abd:+BS, NT/ND Neuro: A&O x3, moving all extremties  Ext:No LE edema bilaterally, no UE edema   Recent Labs Lab 02/11/14 0420 02/11/14 1234 02/12/14 0410  NA 130*  --  136*  K 3.5*  --  4.5  CL 86*  --  97  CO2 27  --  27  GLUCOSE 110*  --  104*  BUN 53*  --  48*  CREATININE 5.86*  --  3.46*  ALBUMIN  --  3.5  --   CALCIUM 8.4  --  8.8  PHOS  --  5.0*  --   AST  --  60*  --   ALT  --  34  --     Liver Function Tests:  Recent Labs Lab 02/11/14 1234  AST 60*  ALT 34  ALKPHOS 93  BILITOT 0.3  PROT 7.0  ALBUMIN 3.5   No results found for this basename: LIPASE, AMYLASE,  in the last 168 hours  Recent Labs Lab 02/12/14 0410  AMMONIA 15    CBC:  Recent Labs Lab 02/11/14 0420  WBC 9.0  HGB 11.3*  HCT 32.8*  MCV 89.4  PLT 185    Cardiac Enzymes:  Recent Labs Lab 02/12/14 0410  CKTOTAL 1736*    CBG: No results found for this basename: GLUCAP,  in the last 168 hours  Iron Studies:  No results found for this basename: IRON, TIBC, TRANSFERRIN, FERRITIN,  in the last 72 hours  Studies/Results: [place] Renal  02/11/2014   CLINICAL DATA:  Acute kidney injury.  Anuria.  EXAM: RENAL/URINARY TRACT ULTRASOUND COMPLETE  COMPARISON:  Limited correlation is made with a CT of the lumbar spine 11/03/2008.  FINDINGS:  Right Kidney:  Length: 10.6 cm. Echogenicity within normal limits. No mass or hydronephrosis visualized.  Left Kidney:  Length: 10.1 cm. Echogenicity within normal limits. No mass or hydronephrosis visualized.  Bladder:  Appears normal for degree of bladder distention.  IMPRESSION: Unremarkable renal ultrasound.  No evidence of hydronephrosis.   Electronically Signed   By: Roxy HorsemanBill  Veazey M.D.   On: 02/11/2014 16:03   . pantoprazole  20 mg Oral Daily  . sodium chloride  3 mL Intravenous Q12H    BMET:    Component Value Date/Time   NA 136* 02/12/2014 0410   K 4.5 02/12/2014 0410   CL 97 02/12/2014 0410   CO2 27 02/12/2014 0410   GLUCOSE 104* 02/12/2014 0410   BUN 48* 02/12/2014 0410   CREATININE 3.46* 02/12/2014 0410   CALCIUM 8.8 02/12/2014 0410   GFRNONAA 15* 02/12/2014 0410   GFRAA 17* 02/12/2014 0410    CBC:    Component Value Date/Time   WBC 9.0 02/11/2014 0420  RBC 3.67* 02/11/2014 0420   HGB 11.3* 02/11/2014 0420   HCT 32.8* 02/11/2014 0420   PLT 185 02/11/2014 0420   MCV 89.4 02/11/2014 0420   MCH 30.8 02/11/2014 0420   MCHC 34.5 02/11/2014 0420   RDW 13.1 02/11/2014 0420     Assessment/Plan:  AKI-  Most likely etiology prerenal more likely given the response to IVF. Likely related to volume depletion from decreased po intake, ACEi, vomiting/diarrhea, NSAID use, and on multiple anticholinergics. Today Cr trending down (Cr 3.46/BUN 48). Good UOP.  Renal US unremarkable.  Will continue to monitor, no urgent need for HD.  -continue IVF @100ml /h  -d/c ACEi, anticholinergics  -strict I/O, daily weights  -renal function panel to monitor electolytes  -avoid nephrotoxins   Metabolic abnormalities-  Mild hyponatremia, calcium and albumin wnl.  -continue IVF @100ml /h   H/O HTN-  stable, BP still soft  -would all all antihypertensives  -continue IVF   Dyslipidemia  Currently on simvastatin.  -would hold in the setting of increased CK (~1000)   Mild anemia-  Stable, no active bleeding, likely  dilutional   -check anemia panel   Chronic pain-  -avoid NSAIDS    Marrian Salvage, MD Internal Medicine Teaching Service, PGY-2

## 2014-02-13 DIAGNOSIS — I10 Essential (primary) hypertension: Secondary | ICD-10-CM

## 2014-02-13 LAB — CK: CK TOTAL: 663 U/L — AB (ref 7–177)

## 2014-02-13 LAB — CBC
HCT: 31.6 % — ABNORMAL LOW (ref 36.0–46.0)
HEMOGLOBIN: 10.4 g/dL — AB (ref 12.0–15.0)
MCH: 30 pg (ref 26.0–34.0)
MCHC: 32.9 g/dL (ref 30.0–36.0)
MCV: 91.1 fL (ref 78.0–100.0)
PLATELETS: 173 10*3/uL (ref 150–400)
RBC: 3.47 MIL/uL — ABNORMAL LOW (ref 3.87–5.11)
RDW: 13.3 % (ref 11.5–15.5)
WBC: 6.7 10*3/uL (ref 4.0–10.5)

## 2014-02-13 LAB — BASIC METABOLIC PANEL
ANION GAP: 12 (ref 5–15)
BUN: 23 mg/dL (ref 6–23)
CALCIUM: 9 mg/dL (ref 8.4–10.5)
CHLORIDE: 106 meq/L (ref 96–112)
CO2: 26 mEq/L (ref 19–32)
CREATININE: 1.34 mg/dL — AB (ref 0.50–1.10)
GFR calc non Af Amer: 47 mL/min — ABNORMAL LOW (ref >90)
GFR, EST AFRICAN AMERICAN: 55 mL/min — AB (ref >90)
Glucose, Bld: 106 mg/dL — ABNORMAL HIGH (ref 70–99)
Potassium: 4.4 mEq/L (ref 3.7–5.3)
Sodium: 144 mEq/L (ref 137–147)

## 2014-02-13 MED ORDER — AMLODIPINE BESYLATE 5 MG PO TABS
5.0000 mg | ORAL_TABLET | Freq: Every day | ORAL | Status: AC
Start: 2014-02-13 — End: ?

## 2014-02-13 MED ORDER — ALPRAZOLAM 0.25 MG PO TABS
0.2500 mg | ORAL_TABLET | Freq: Two times a day (BID) | ORAL | Status: AC
Start: 2014-02-13 — End: ?

## 2014-02-13 MED ORDER — AMLODIPINE BESYLATE 5 MG PO TABS
5.0000 mg | ORAL_TABLET | Freq: Every day | ORAL | Status: DC
  Filled 2014-02-13: qty 1

## 2014-02-13 NOTE — Progress Notes (Signed)
I have personally seen and examined this patient and agree with the assessment/plan as outlined above by Delane GingerGill MD. Appears well had been adequately charted/collected urine output overnight. Labs from this morning show improvement of creatinine to 1.34 and no other acute electrolyte abnormalities. Would recommend discontinuation of intravenous fluids and encouragement of oral intake. Restart antihypertensive therapy-would favor amlodipine 5 mg daily (decision on resuming ACE inhibitor can be made as an outpatient) We'll sign off at this point, please call if questions arise. She does not need a scheduled renal followup and can followup with her primary care provider and be referred as needed.  Regino Fournet K.,MD 02/13/2014 11:37 AM

## 2014-02-13 NOTE — Progress Notes (Signed)
Pt left floor via wheelchair accompanied by staff and family. 

## 2014-02-13 NOTE — Progress Notes (Signed)
Patient ID: Michaela Stevenson female   DOB: January 20, 1971 43 y.o.   MRN: [id number]  S:Pt is doing well this AM. UOP: 400ml yesterday?? Highly doubt this is accurate as pt reports urinating well.  Denies any CP, SOB, N/V/D/C.  Endorses significant gas.   O:  Vital signs: BP 148/86  Pulse 79  Temp(Src) 98.4 F (36.9 C) (Oral)  Resp 20  Ht 5' 2.5" (1.588 m)  Wt 181 lb 7 oz (82.3 kg)  BMI 32.64 kg/m2  SpO2 100%  Intake/Output:  Intake/Output Summary (Last 24 hours) at 02/13/14 1019 Last data filed at 02/13/14 0500  Gross per 24 hour  Intake      0 ml  Output    400 ml  Net   -400 ml    Weight change: Filed Weights   02/11/14 0137 02/12/14 2100  Weight: 181 lb (82.1 kg) 181 lb 7 oz (82.3 kg)    Physical Exam: Gen:NAD, resting comfortably  CVS:RRR Resp:CTAB Abd:+BS, NT/ND Neuro: A&O x3, moving all extremties  Ext:No LE edema bilaterally, no UE edema   Recent Labs Lab 02/11/14 0420 02/11/14 1234 02/12/14 0410  NA 130*  --  136*  K 3.5*  --  4.5  CL 86*  --  97  CO2 27  --  27  GLUCOSE 110*  --  104*  BUN 53*  --  48*  CREATININE 5.86*  --  3.46*  ALBUMIN  --  3.5  --   CALCIUM 8.4  --  8.8  PHOS  --  5.0*  --   AST  --  60*  --   ALT  --  34  --     Liver Function Tests:  Recent Labs Lab 02/11/14 1234  AST 60*  ALT 34  ALKPHOS 93  BILITOT 0.3  PROT 7.0  ALBUMIN 3.5   No results found for this basename: LIPASE, AMYLASE,  in the last 168 hours  Recent Labs Lab 02/12/14 0410  AMMONIA 15    CBC:  Recent Labs Lab 02/11/14 0420  WBC 9.0  HGB 11.3*  HCT 32.8*  MCV 89.4  PLT 185    Cardiac Enzymes:  Recent Labs Lab 02/12/14 0410  CKTOTAL 1736*    CBG: No results found for this basename: GLUCAP,  in the last 168 hours  Iron Studies:  No results found for this basename: IRON, TIBC, TRANSFERRIN, FERRITIN,  in the last 72 hours  Studies/Results: Ct Head Wo Contrast  02/12/2014   CLINICAL DATA:  Headache.  EXAM: CT HEAD WITHOUT CONTRAST   TECHNIQUE: Contiguous axial images were obtained from the base of the skull through the vertex without intravenous contrast.  COMPARISON:  02/10/2014  FINDINGS: Ventricles are normal in size and configuration. No parenchymal masses or mass effect. There are no areas of abnormal parenchymal attenuation. No evidence of an infarct.  No extra-axial masses or abnormal fluid collections.  There is no intracranial hemorrhage.  Visualized sinuses and mastoid air cells are clear. No skull lesion.  IMPRESSION: Normal unenhanced CT scan of the brain.   Electronically Signed   By: Amie Portlandavid  Ormond M.D.   On: 02/12/2014 20:43   [place] Renal  02/11/2014   CLINICAL DATA:  Acute kidney injury.  Anuria.  EXAM: RENAL/URINARY TRACT ULTRASOUND COMPLETE  COMPARISON:  Limited correlation is made with a CT of the lumbar spine 11/03/2008.  FINDINGS: Right Kidney:  Length: 10.6 cm. Echogenicity within normal limits. No mass or hydronephrosis visualized.  Left Kidney:  Length:  10.1 cm. Echogenicity within normal limits. No mass or hydronephrosis visualized.  Bladder:  Appears normal for degree of bladder distention.  IMPRESSION: Unremarkable renal ultrasound.  No evidence of hydronephrosis.   Electronically Signed   By: Roxy Horseman M.D.   On: 02/11/2014 16:03   . ALPRAZolam  0.25 mg Oral BID  . pantoprazole  20 mg Oral Daily  . sodium chloride  3 mL Intravenous Q12H    BMET:    Component Value Date/Time   NA 136* 02/12/2014 0410   K 4.5 02/12/2014 0410   CL 97 02/12/2014 0410   CO2 27 02/12/2014 0410   GLUCOSE 104* 02/12/2014 0410   BUN 48* 02/12/2014 0410   CREATININE 3.46* 02/12/2014 0410   CALCIUM 8.8 02/12/2014 0410   GFRNONAA 15* 02/12/2014 0410   GFRAA 17* 02/12/2014 0410    CBC:    Component Value Date/Time   WBC 9.0 02/11/2014 0420   RBC 3.67* 02/11/2014 0420   HGB 11.3* 02/11/2014 0420   HCT 32.8* 02/11/2014 0420   PLT 185 02/11/2014 0420   MCV 89.4 02/11/2014 0420   MCH 30.8 02/11/2014 0420   MCHC 34.5 02/11/2014 0420   RDW 13.1 02/11/2014  0420     Assessment/Plan:  AKI-  Most likely etiology prerenal given response to IVF. Likely related to volume depletion from decreased po intake, ACEi, vomiting/diarrhea, NSAID use, and multiple anticholinergics. Labs pending today.  Doubt urine actually being measured.  Renal US unremarkable.  Will continue to monitor, no urgent need for HD.  -continue IVF @100ml /h pending labs  -d/c ACEi, anticholinergics  -strict I/O, daily weights  -renal function panel to monitor electolytes  -avoid nephrotoxins   Metabolic abnormalities-  Mild hyponatremia, calcium and albumin wnl.  -continue IVF @100ml /h   H/O HTN-  stable. -consider adding amlodipine for BP control  -no ACEi/ARB  -continue IVF   Dyslipidemia  Currently on simvastatin.  -would hold in the setting of increased CK (~1000)   Mild anemia-  Stable, no active bleeding, likely dilutional   -check anemia panel   Chronic pain-  -avoid NSAIDS   Marrian Salvage, MD Internal Medicine Teaching Service, PGY-2

## 2014-02-13 NOTE — Discharge Summary (Signed)
Physician Discharge Summary  Michaela HildingJennifer C Schlarb JXB:[id number] DOB: 09-10-1970 DOA: 02/11/2014  PCP: Otila Back'BUCH,GRETA, PA-C  Admit date: 02/11/2014 Discharge date: 02/13/2014  Recommendations for Outpatient Follow-up:  1. Pt will need to follow up with PCP in 1 week post discharge 2. Please obtain BMP 1 week 3. Please also check CPK in one week   Discharge Diagnoses:  AKI  -likely volume depletion as pt improving with IVF  -pt had serum creatinine 7.40 at [facility]  -unclear renal baseline but serum creatinine on day of d/c is 1.34 -renal US--neg  -CPK 1188-->1736-->d/c statin  -would not restart statin until CPK is rechecked with PCP and is normalized -recheck CK --663 on day of d/c -continue IVF-->improving renal function -appreciate nephrology followup  -d/c lisinopril altogether -start amlodipine 5mg  daily for BP Acute Encephalopathy  -likely multifactorial related to meds and AKI  -Pt was caught by nursing staff with with pill bottle--suspect pt is taking home medications  -informed nursing supervisor to search room and remove any home meds -since being "caught" with taking home meds, the patient's BP and mentation significantly improved--in fact pt became hypertensive again  -CT brain--neg  -ammonia--15  -B12--802  -TSH--0.813  -RPR, HIV--negative  -minimize opioids/narcotics--during hospital stay she only received oxycodone IR 5mg  once to twice a day--she will not be d/ced with any Rx for oxycodone -Xanax 0.25mg  bid, #10, no refills-->follow up with PCP -instructed pt pt to follow up with PCP with regard to further RX for xanax, phenergan, vistaril, and oxycodone Hypotension  -suspect this may be self induced from taking hidden home meds  -continue IVF  -Echo--EF 65-70%, no wall motion abnormalities -since being "caught" with taking home meds, the patient's BP and mentation significantly improved--in fact pt became hypertensive again -amlodipine 5 mg  daily Hypertension  -Discontinue lisinopril altogether  -IV fluids  -amlodipine 5mg  daily Chronic pain  -U[facility] drug screen [facility] shows amphetamines and opioids  -Patient denies any use of amphetamines--suspect pt is taking son's Vyvanse  -Hold opioids for now -home doses of xanax, oxyIR, vistaril, phenergan, Kadian were discontinued during hospitalization -She only received xanax 0.25mg  bid during hospitalization with oxyIR 5mg  one to two times daily   Discharge Condition: stable  Disposition: home  Diet:heart healthy Wt Readings from Last 3 Encounters:  02/12/14 82.3 kg (181 lb 7 oz)    History of present illness:  Interim history  43 year old female with a history of hypertension, depression, fibromyalgia admitted for low urine output. The patient is a very poor historian. At [facility], BUN was 58, serum creatinine 7.4. The patient was noted to be hypotensive and given 2 L normal saline. During the interview on 02/11/2014, the patient appears to be confused with tangential history. The patient's only complaint is that of insomnia with the inability to sleep for the last 72 hours. Pt was started on IVF and her xanax and oxyIR doses were significantly reduced.  Her mentation gradually improved.  Nephrology was consulted and agreed with present management.  They felt that volume depletion rather than rhabdomyolysis to be the primary cause of her AKI.  Nevertheless, her zocor was d/ced due to CPK of 1188 which improved with IVF.   Her renal function continued to improve with IVF.  Family was updated.  I strongly encouraged pt to minimize her opioid and hypnotic use.       Consultants: Nephrology  Discharge Exam: Filed Vitals:   02/13/14 1147  BP: 162/92  Pulse: 88  Temp: 98.3 F (  36.8 C)  Resp: 20   Filed Vitals:   02/12/14 2100 02/13/14 0500 02/13/14 0803 02/13/14 1147  BP: 112/74 115/70 148/86 162/92  Pulse: 88 84 79 88  Temp: 98.8 F (37.1 C) 98.3 F  (36.8 C) 98.4 F (36.9 C) 98.3 F (36.8 C)  TempSrc: Oral Oral Oral Oral  Resp: 18 18 20 20   Height:      Weight: 82.3 kg (181 lb 7 oz)     SpO2: 97% 100% 100% 99%   General: A&O x 3, NAD, pleasant, cooperative Cardiovascular: RRR, no rub, no gallop, no S3 Respiratory: CTAB, no wheeze, no rhonchi Abdomen:soft, nontender, nondistended, positive bowel sounds Extremities: trace LE edema, No lymphangitis, no petechiae  Discharge Instructions      Discharge Instructions   Diet - low sodium heart healthy    Complete by:  As directed      Discharge instructions    Complete by:  As directed   Stop lisinopril Start amlodipine 5 mg once daily Do NOT take zocor until you follow up with your primary care physician     Increase activity slowly    Complete by:  As directed             Medication List    STOP taking these medications       cyclobenzaprine 10 MG tablet  Commonly known as:  FLEXERIL     hydrOXYzine 25 MG capsule  Commonly known as:  VISTARIL     lisinopril 20 MG tablet  Commonly known as:  PRINIVIL,ZESTRIL     morphine 20 MG 24 hr capsule  Commonly known as:  KADIAN     oxycodone 30 MG immediate release tablet  Commonly known as:  ROXICODONE     simvastatin 80 MG tablet  Commonly known as:  ZOCOR      TAKE these medications       albuterol-ipratropium 18-103 MCG/ACT inhaler  Commonly known as:  COMBIVENT  Inhale 2 puffs into the lungs every 4 (four) hours as needed for wheezing or shortness of breath.     ALIGN PO  Take 1 capsule by mouth daily.     ALPRAZolam 0.25 MG tablet  Commonly known as:  XANAX  Take 1 tablet (0.25 mg total) by mouth 2 (two) times daily.     amLODipine 5 MG tablet  Commonly known as:  NORVASC  Take 1 tablet (5 mg total) by mouth daily.     dicyclomine 10 MG capsule  Commonly known as:  BENTYL  Take 10 mg by mouth 4 (four) times daily.     docusate sodium 100 MG capsule  Commonly known as:  COLACE  Take 200 mg by  mouth daily.     gabapentin 300 MG capsule  Commonly known as:  NEURONTIN  Take 600 mg by mouth 3 (three) times daily.     loratadine 10 MG tablet  Commonly known as:  CLARITIN  Take 10 mg by mouth daily.     PATANASE 0.6 % Soln  Generic drug:  Olopatadine HCl  Place 1 puff into the nose daily as needed (for allergies).     polyethylene glycol packet  Commonly known as:  MIRALAX / GLYCOLAX  Take 17 g by mouth daily as needed for mild constipation.     promethazine 25 MG tablet  Commonly known as:  PHENERGAN  Take 25 mg by mouth every 6 (six) hours as needed for nausea or vomiting.     ranitidine 150  MG tablet  Commonly known as:  ZANTAC  Take 150 mg by mouth 2 (two) times daily.         The results of significant diagnostics from this hospitalization (including imaging, microbiology, ancillary and laboratory) are listed below for reference.    Significant Diagnostic Studies: Ct Head Wo Contrast  02/12/2014   CLINICAL DATA:  Headache.  EXAM: CT HEAD WITHOUT CONTRAST  TECHNIQUE: Contiguous axial images were obtained from the base of the skull through the vertex without intravenous contrast.  COMPARISON:  02/10/2014  FINDINGS: Ventricles are normal in size and configuration. No parenchymal masses or mass effect. There are no areas of abnormal parenchymal attenuation. No evidence of an infarct.  No extra-axial masses or abnormal fluid collections.  There is no intracranial hemorrhage.  Visualized sinuses and mastoid air cells are clear. No skull lesion.  IMPRESSION: Normal unenhanced CT scan of the brain.   Electronically Signed   By: Amie Portland M.D.   On: 02/12/2014 20:43   US Renal  02/11/2014   CLINICAL DATA:  Acute kidney injury.  Anuria.  EXAM: RENAL/URINARY TRACT ULTRASOUND COMPLETE  COMPARISON:  Limited correlation is made with a CT of the lumbar spine 11/03/2008.  FINDINGS: Right Kidney:  Length: 10.6 cm. Echogenicity within normal limits. No mass or hydronephrosis  visualized.  Left Kidney:  Length: 10.1 cm. Echogenicity within normal limits. No mass or hydronephrosis visualized.  Bladder:  Appears normal for degree of bladder distention.  IMPRESSION: Unremarkable renal ultrasound.  No evidence of hydronephrosis.   Electronically Signed   By: Roxy Horseman M.D.   On: 02/11/2014 16:03     Microbiology: No results found for this or any previous visit (from the past 240 hour(s)).   Labs: Basic Metabolic Panel:  Recent Labs Lab 02/11/14 0420 02/11/14 1234 02/12/14 0410 02/13/14 1025  NA 130*  --  136* 144  K 3.5*  --  4.5 4.4  CL 86*  --  97 106  CO2 27  --  27 26  GLUCOSE 110*  --  104* 106*  BUN 53*  --  48* 23  CREATININE 5.86*  --  3.46* 1.34*  CALCIUM 8.4  --  8.8 9.0  MG  --  2.2  --   --   PHOS  --  5.0*  --   --    Liver Function Tests:  Recent Labs Lab 02/11/14 1234  AST 60*  ALT 34  ALKPHOS 93  BILITOT 0.3  PROT 7.0  ALBUMIN 3.5   No results found for this basename: LIPASE, AMYLASE,  in the last 168 hours  Recent Labs Lab 02/12/14 0410  AMMONIA 15   CBC:  Recent Labs Lab 02/11/14 0420 02/13/14 1025  WBC 9.0 6.7  HGB 11.3* 10.4*  HCT 32.8* 31.6*  MCV 89.4 91.1  PLT 185 173   Cardiac Enzymes:  Recent Labs Lab 02/12/14 0410 02/13/14 1025  CKTOTAL 1736* 663*   BNP: No components found with this basename: POCBNP,  CBG: No results found for this basename: GLUCAP,  in the last 168 hours  Time coordinating discharge:  Greater than 30 minutes  Signed:  Jeriyah Granlund, DO [facility] Pager: [phone] 02/13/2014, 12:56 PM

## 2014-02-13 NOTE — Progress Notes (Signed)
Pt discharge instructions given, pt verbalized understanding.   

## 2014-02-14 LAB — CORTISOL-AM, BLOOD: Cortisol - AM: 20.6 ug/dL (ref 4.3–22.4)

## 2014-10-06 DEATH — deceased

## 2015-09-23 IMAGING — US US RENAL
1 series · 14 of 25 positions shown · non-contrast
Comparison: Limited correlation is made with a CT of the lumbar
spine 11/03/2008.

CLINICAL DATA: Acute kidney injury.  Anuria.

EXAM:
RENAL/URINARY TRACT ULTRASOUND COMPLETE

[Series 1: us renal · 0.22mm/px · 14 of 35 slices shown]
[im 1/35]
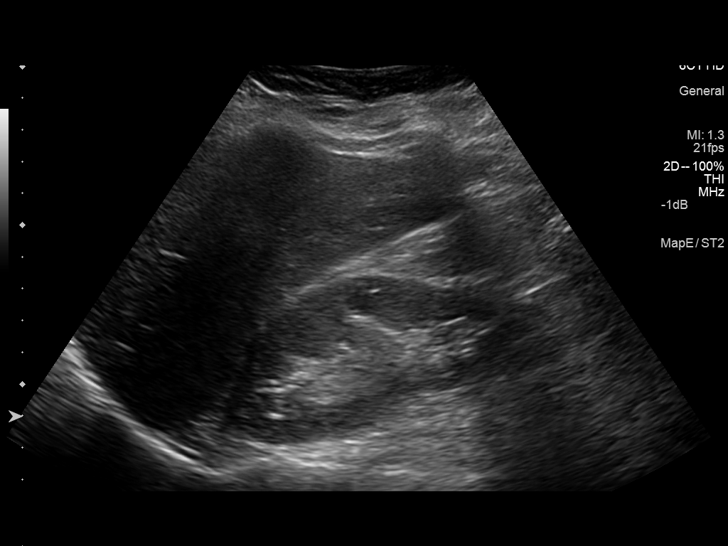
[im 3/35]
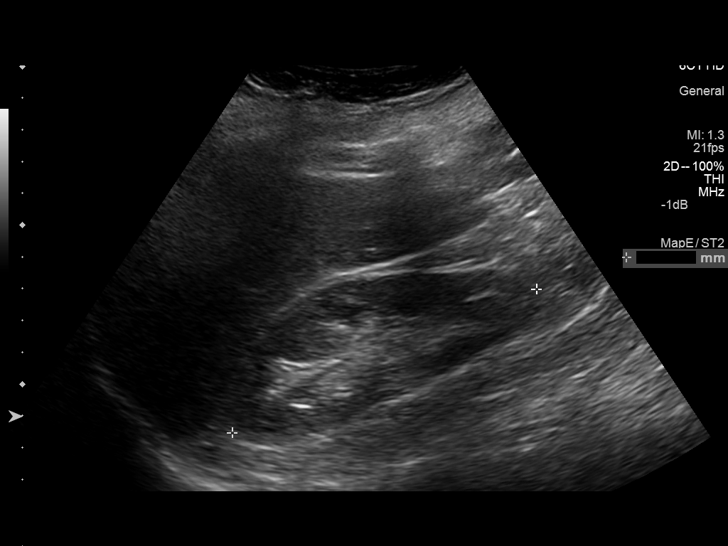
[im 6/35]
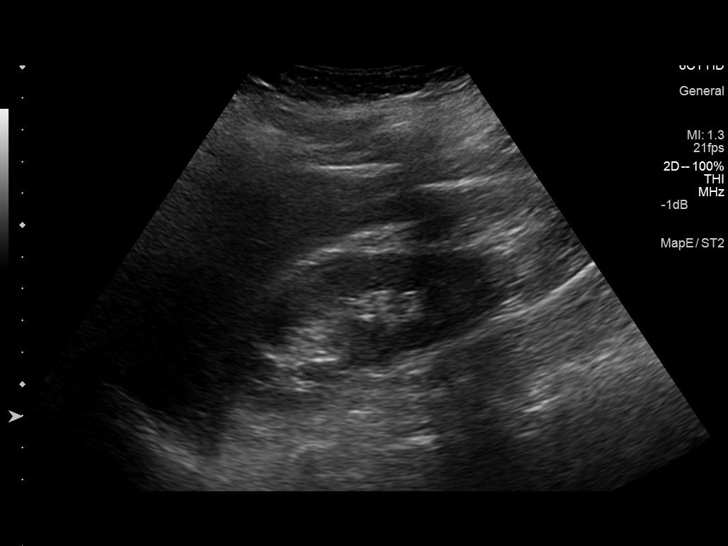
[im 9/35]
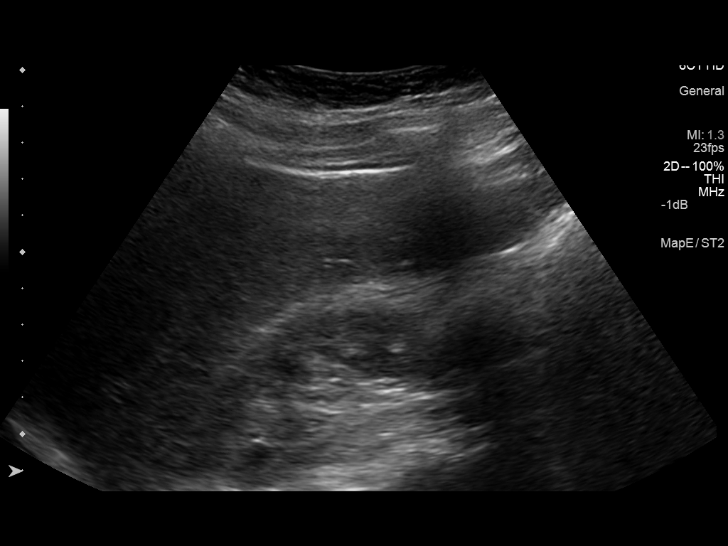
[im 12/35]
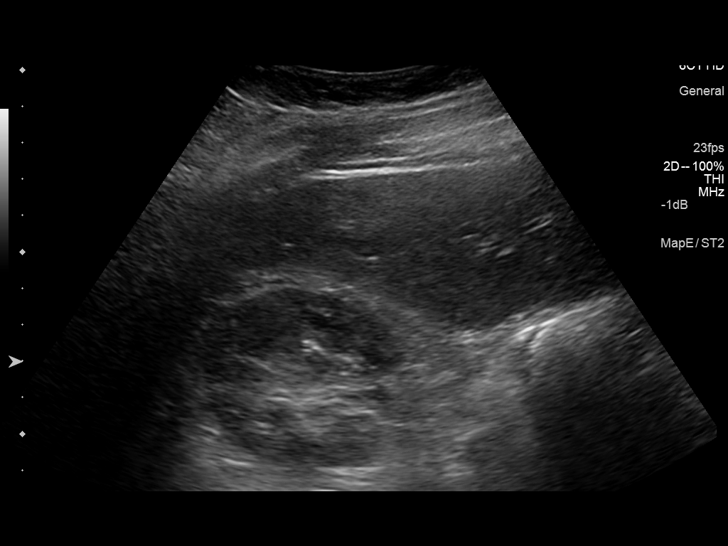
[im 13/35]
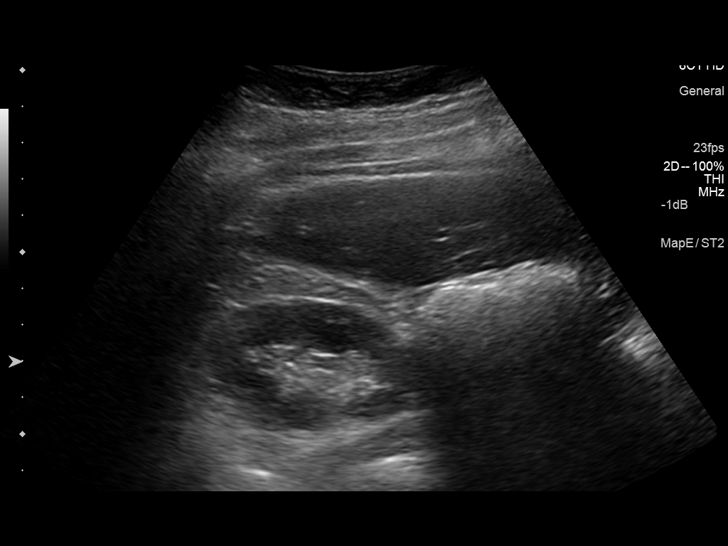
[im 16/35]
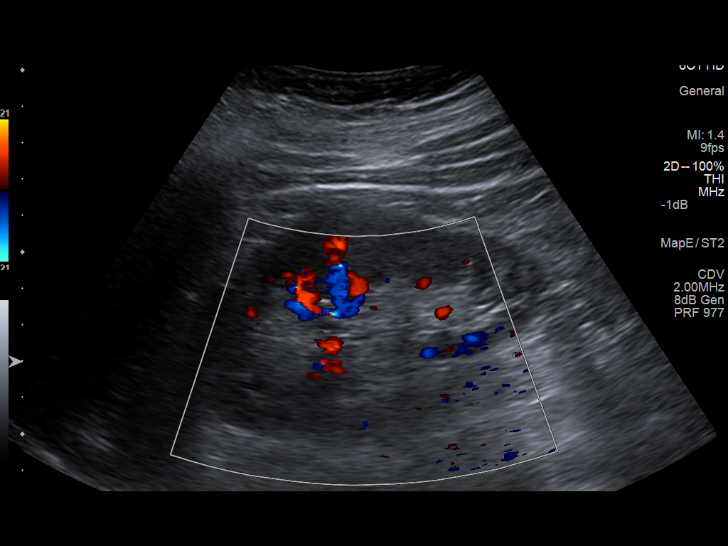
[im 19/35]
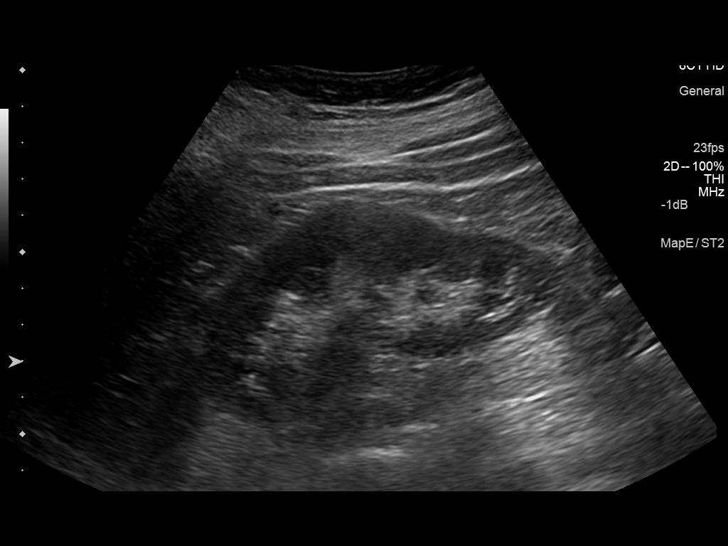
[im 22/35]
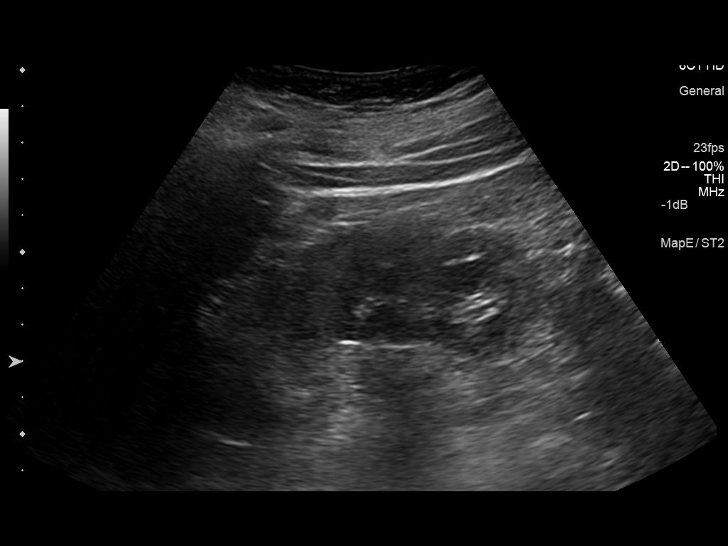
[im 23/35]
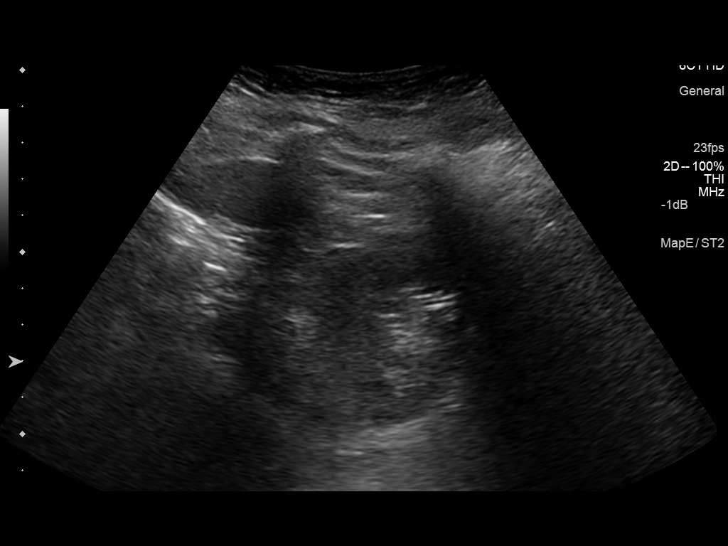
[im 26/35]
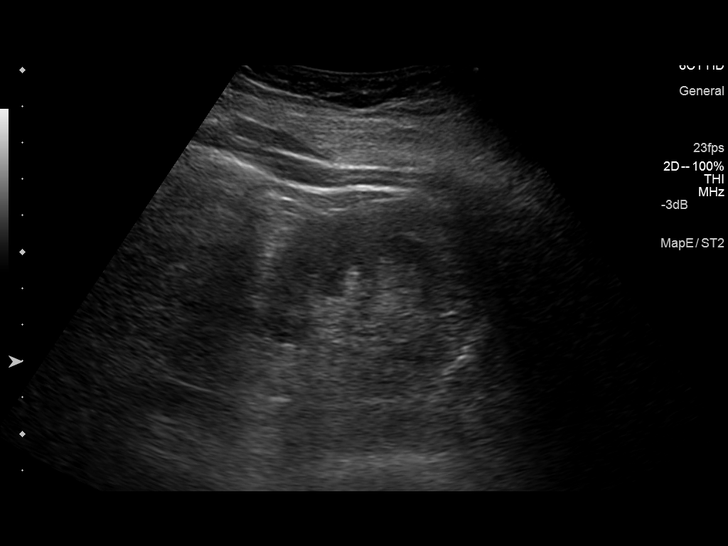
[im 29/35]
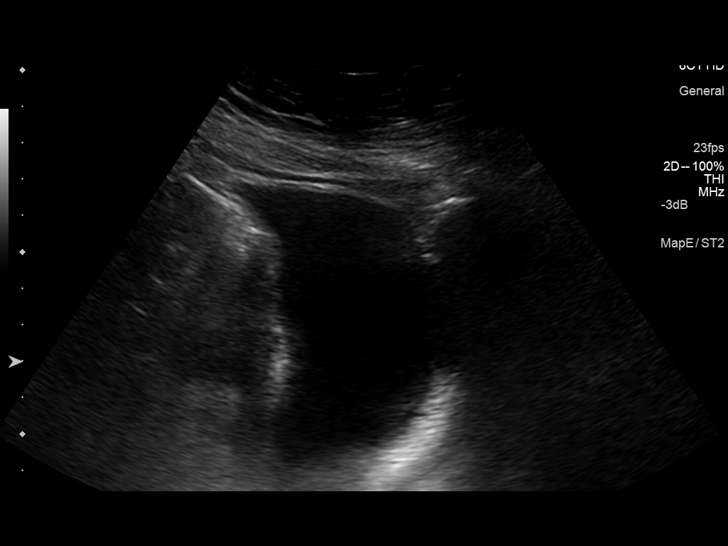
[im 32/35]
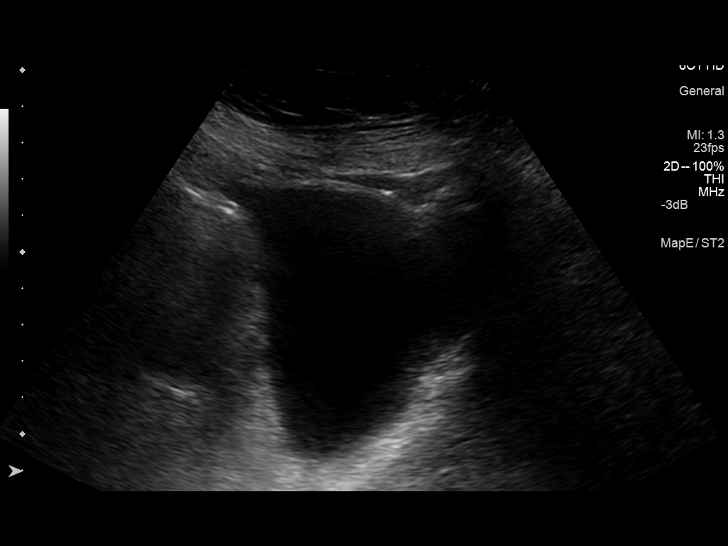
[im 35/35]
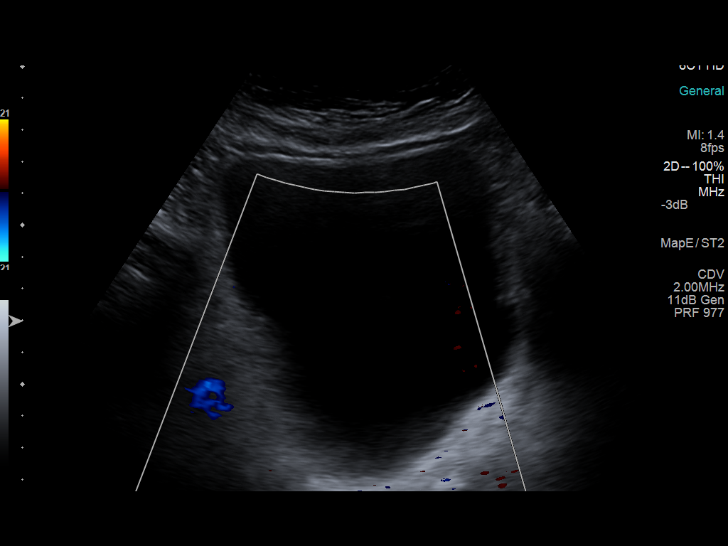

[14 of 25 positions shown; findings below may reference images not displayed]

FINDINGS: Right Kidney:

Length: 10.6 cm. Echogenicity within normal limits. No mass or
hydronephrosis visualized.

Left Kidney:

Length: 10.1 cm. Echogenicity within normal limits. No mass or
hydronephrosis visualized.

Bladder:

Appears normal for degree of bladder distention.
IMPRESSION: Unremarkable renal ultrasound.  No evidence of hydronephrosis.

## 2015-09-24 IMAGING — CT CT HEAD W/O CM
1 series · 16 of 28 positions shown, 20 images · non-contrast
Comparison: 02/10/2014

CLINICAL DATA: Headache.

EXAM:
CT HEAD WITHOUT CONTRAST
TECHNIQUE: Contiguous axial images were obtained from the base of the skull
through the vertex without intravenous contrast.

[Series 2: head 5.0 h30s · axial · 0.41mm/px · z∈[-44,+81]mm · 16 of 28 slices shown, 20 images]
[im 2/28  brain]
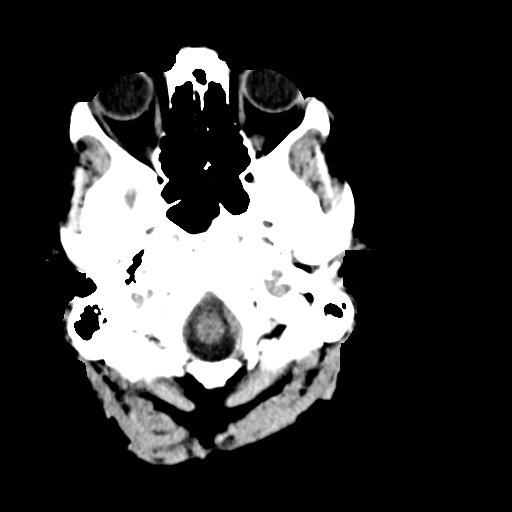
[im 2/28  bone]
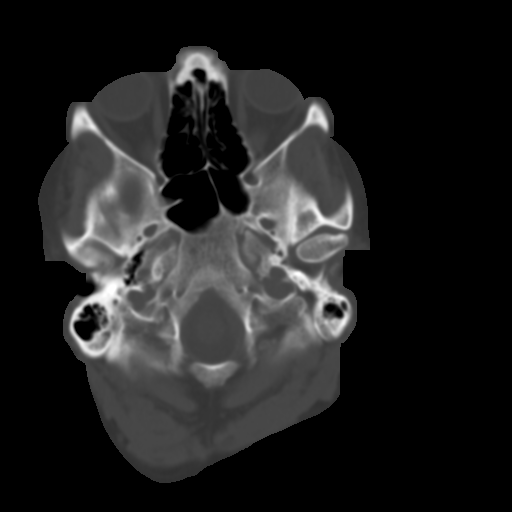
[im 4/28  brain]
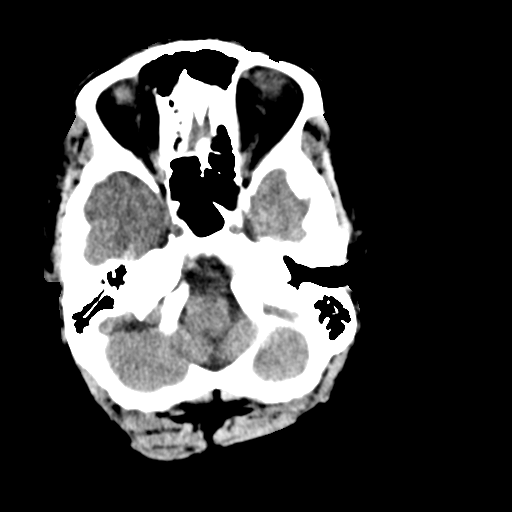
[im 6/28  brain]
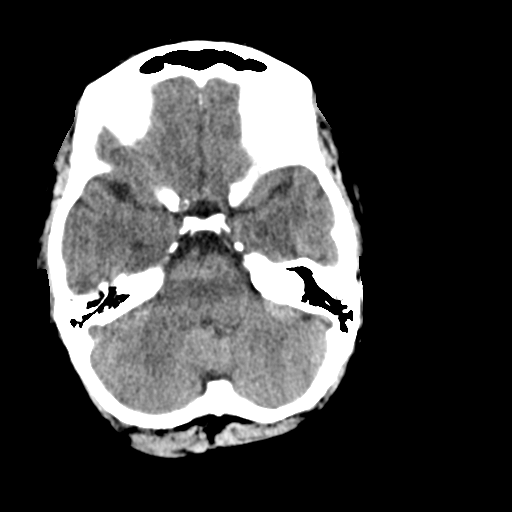
[im 7/28  brain]
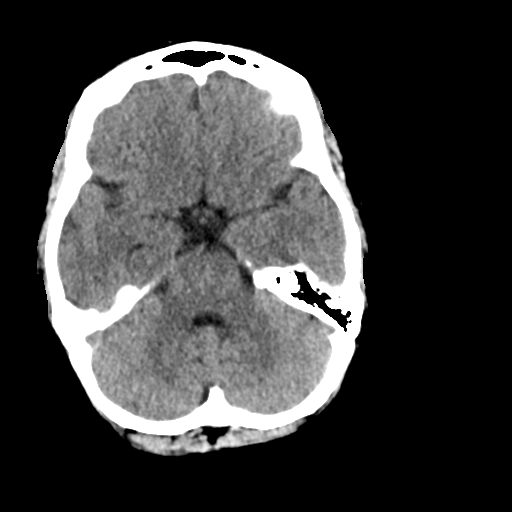
[im 9/28  brain]
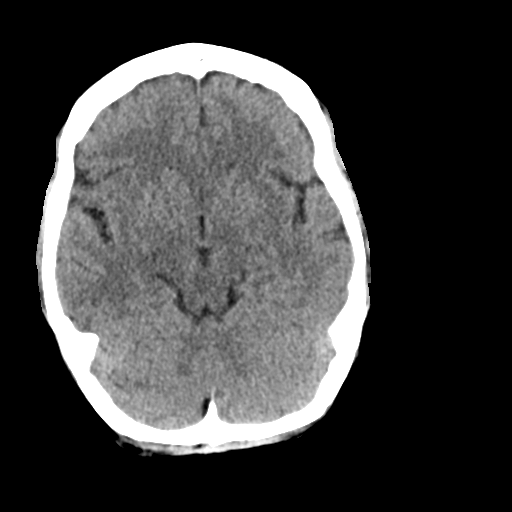
[im 9/28  bone]
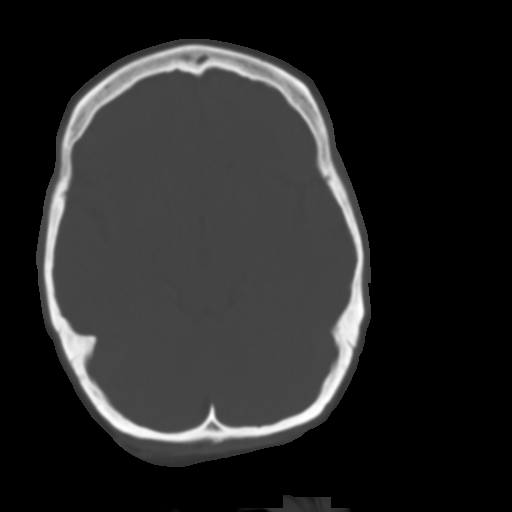
[im 10/28  brain]
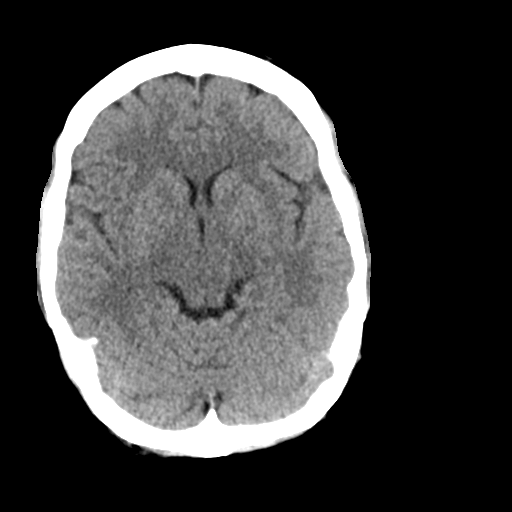
[im 12/28  brain]
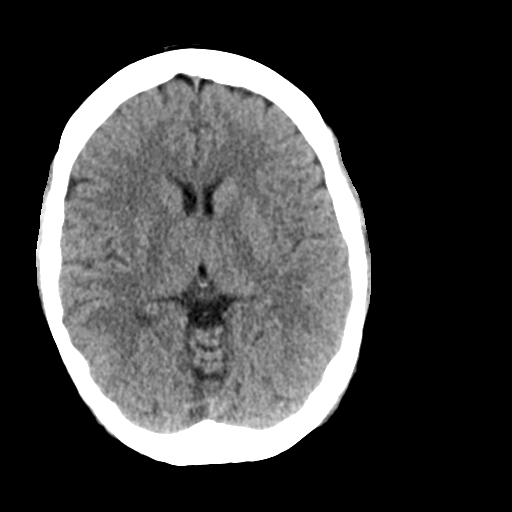
[im 14/28  brain]
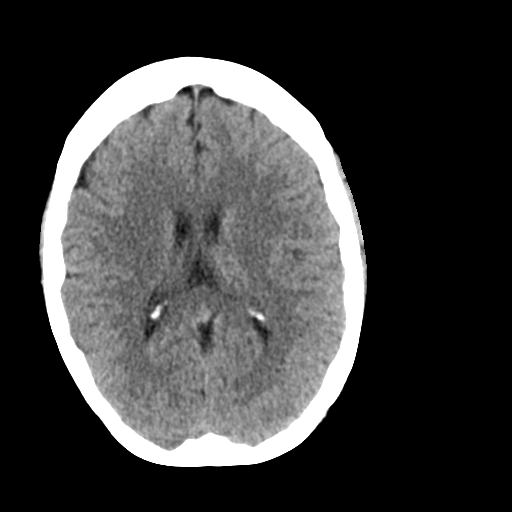
[im 15/28  brain]
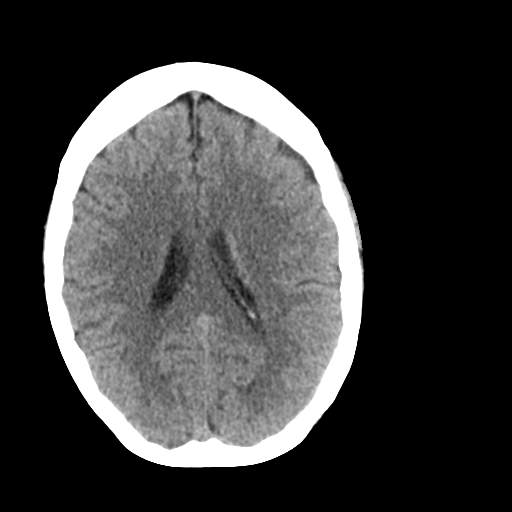
[im 15/28  bone]
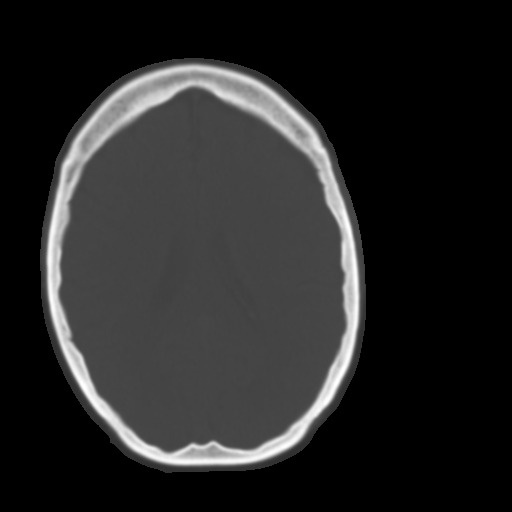
[im 17/28  brain]
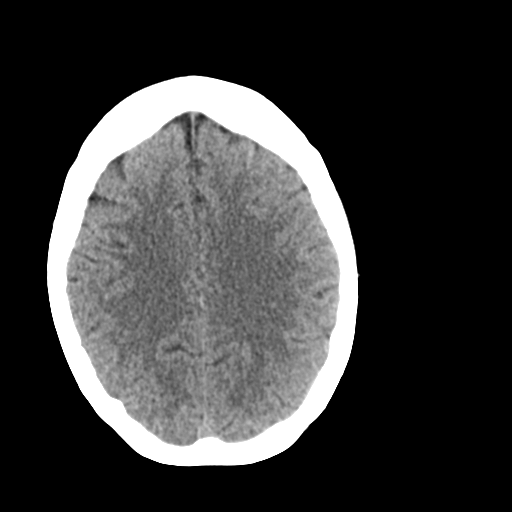
[im 19/28  brain]
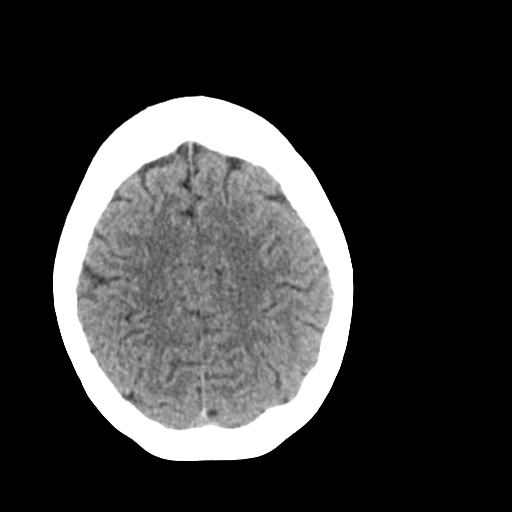
[im 20/28  brain]
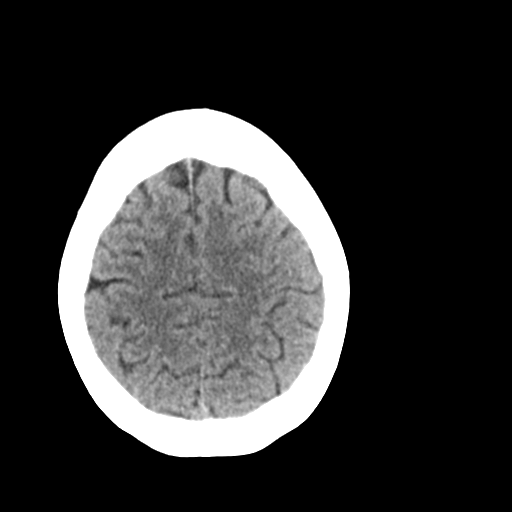
[im 22/28  brain]
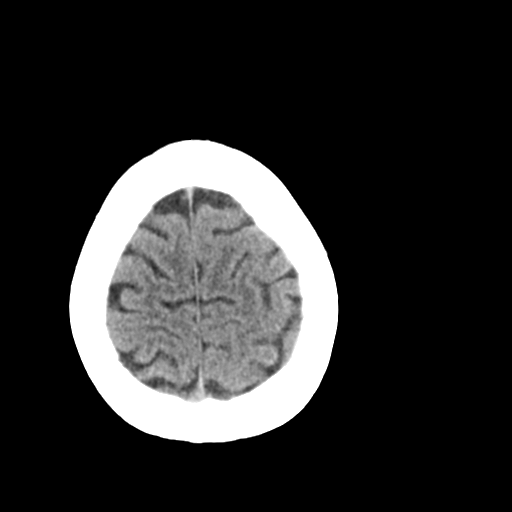
[im 22/28  bone]
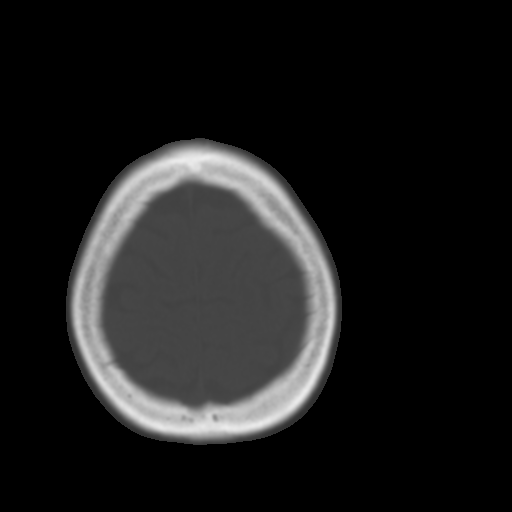
[im 23/28  brain]
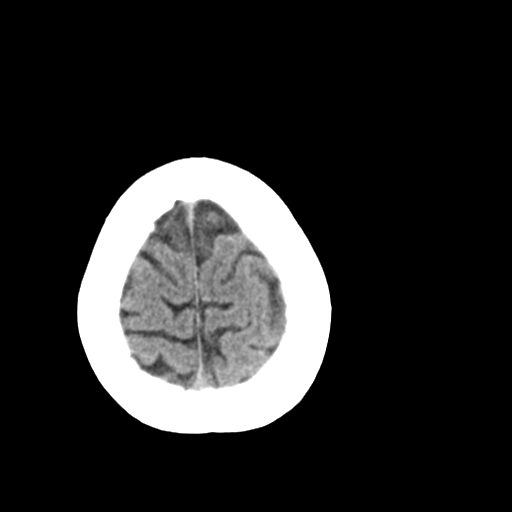
[im 25/28  brain]
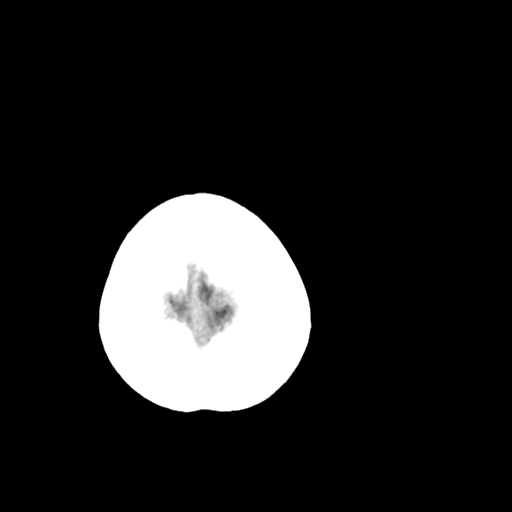
[im 27/28  brain]
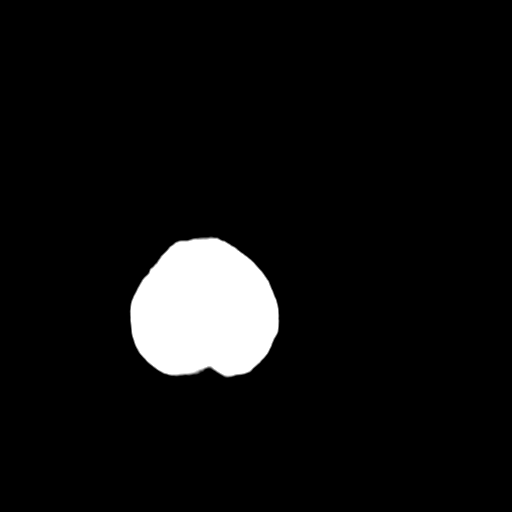

[16 of 28 positions shown; findings below may reference images not displayed]

FINDINGS: Ventricles are normal in size and configuration. No parenchymal
masses or mass effect. There are no areas of abnormal parenchymal
attenuation. No evidence of an infarct.

No extra-axial masses or abnormal fluid collections.

There is no intracranial hemorrhage.

Visualized sinuses and mastoid air cells are clear. No skull lesion.
IMPRESSION: Normal unenhanced CT scan of the brain.

## 2017-07-26 ENCOUNTER — Ambulatory Visit: Payer: Self-pay
